# Patient Record
Sex: Female | Born: 1950 | Hispanic: No | Marital: Married
Health system: Southern US, Community
[De-identification: ages and names within clinical notes are randomized; demographics above are authoritative.]

---

## 1999-03-16 ENCOUNTER — Other Ambulatory Visit: Admission: RE | Admit: 1999-03-16 | Discharge: 1999-03-16 | Payer: Self-pay | Admitting: Obstetrics and Gynecology

## 1999-11-10 ENCOUNTER — Encounter: Payer: Self-pay | Admitting: Surgery

## 1999-11-10 ENCOUNTER — Encounter: Admission: RE | Admit: 1999-11-10 | Discharge: 1999-11-10 | Payer: Self-pay | Admitting: Surgery

## 2000-03-16 ENCOUNTER — Other Ambulatory Visit: Admission: RE | Admit: 2000-03-16 | Discharge: 2000-03-16 | Payer: Self-pay | Admitting: Obstetrics and Gynecology

## 2001-03-27 ENCOUNTER — Other Ambulatory Visit: Admission: RE | Admit: 2001-03-27 | Discharge: 2001-03-27 | Payer: Self-pay | Admitting: Obstetrics and Gynecology

## 2001-10-01 ENCOUNTER — Encounter: Admission: RE | Admit: 2001-10-01 | Discharge: 2001-10-01 | Payer: Self-pay | Admitting: Obstetrics and Gynecology

## 2001-10-01 ENCOUNTER — Encounter: Payer: Self-pay | Admitting: Obstetrics and Gynecology

## 2003-03-25 ENCOUNTER — Other Ambulatory Visit: Admission: RE | Admit: 2003-03-25 | Discharge: 2003-03-25 | Payer: Self-pay | Admitting: Obstetrics and Gynecology

## 2004-05-07 ENCOUNTER — Other Ambulatory Visit: Admission: RE | Admit: 2004-05-07 | Discharge: 2004-05-07 | Payer: Self-pay | Admitting: Obstetrics and Gynecology

## 2004-05-12 ENCOUNTER — Ambulatory Visit (HOSPITAL_COMMUNITY): Admission: RE | Admit: 2004-05-12 | Discharge: 2004-05-12 | Payer: Self-pay | Admitting: Obstetrics and Gynecology

## 2005-05-26 ENCOUNTER — Other Ambulatory Visit: Admission: RE | Admit: 2005-05-26 | Discharge: 2005-05-26 | Payer: Self-pay | Admitting: Obstetrics and Gynecology

## 2007-11-12 ENCOUNTER — Ambulatory Visit (HOSPITAL_COMMUNITY): Admission: RE | Admit: 2007-11-12 | Discharge: 2007-11-12 | Payer: Self-pay | Admitting: Obstetrics and Gynecology

## 2009-11-19 ENCOUNTER — Ambulatory Visit (HOSPITAL_COMMUNITY): Admission: RE | Admit: 2009-11-19 | Discharge: 2009-11-19 | Payer: Self-pay | Admitting: Obstetrics and Gynecology

## 2021-06-07 ENCOUNTER — Inpatient Hospital Stay (HOSPITAL_COMMUNITY): Payer: Medicare PPO

## 2021-06-07 ENCOUNTER — Emergency Department (HOSPITAL_COMMUNITY): Payer: Medicare PPO

## 2021-06-07 ENCOUNTER — Encounter (HOSPITAL_COMMUNITY): Payer: Self-pay | Admitting: Emergency Medicine

## 2021-06-07 ENCOUNTER — Inpatient Hospital Stay (HOSPITAL_COMMUNITY)
Admission: EM | Admit: 2021-06-07 | Discharge: 2021-06-26 | DRG: 082 | Disposition: E | Payer: Medicare PPO | Attending: Internal Medicine | Admitting: Internal Medicine

## 2021-06-07 DIAGNOSIS — R29729 NIHSS score 29: Secondary | ICD-10-CM | POA: Diagnosis present

## 2021-06-07 DIAGNOSIS — Z66 Do not resuscitate: Secondary | ICD-10-CM | POA: Diagnosis not present

## 2021-06-07 DIAGNOSIS — G935 Compression of brain: Secondary | ICD-10-CM | POA: Diagnosis present

## 2021-06-07 DIAGNOSIS — S06369A Traumatic hemorrhage of cerebrum, unspecified, with loss of consciousness of unspecified duration, initial encounter: Secondary | ICD-10-CM | POA: Diagnosis present

## 2021-06-07 DIAGNOSIS — I611 Nontraumatic intracerebral hemorrhage in hemisphere, cortical: Secondary | ICD-10-CM | POA: Diagnosis not present

## 2021-06-07 DIAGNOSIS — E854 Organ-limited amyloidosis: Secondary | ICD-10-CM | POA: Diagnosis present

## 2021-06-07 DIAGNOSIS — I619 Nontraumatic intracerebral hemorrhage, unspecified: Secondary | ICD-10-CM | POA: Diagnosis present

## 2021-06-07 DIAGNOSIS — E7800 Pure hypercholesterolemia, unspecified: Secondary | ICD-10-CM | POA: Diagnosis present

## 2021-06-07 DIAGNOSIS — J9601 Acute respiratory failure with hypoxia: Secondary | ICD-10-CM

## 2021-06-07 DIAGNOSIS — Z515 Encounter for palliative care: Secondary | ICD-10-CM

## 2021-06-07 DIAGNOSIS — I615 Nontraumatic intracerebral hemorrhage, intraventricular: Secondary | ICD-10-CM | POA: Diagnosis not present

## 2021-06-07 DIAGNOSIS — Y92009 Unspecified place in unspecified non-institutional (private) residence as the place of occurrence of the external cause: Secondary | ICD-10-CM

## 2021-06-07 DIAGNOSIS — Z4659 Encounter for fitting and adjustment of other gastrointestinal appliance and device: Secondary | ICD-10-CM

## 2021-06-07 DIAGNOSIS — I10 Essential (primary) hypertension: Secondary | ICD-10-CM | POA: Diagnosis present

## 2021-06-07 DIAGNOSIS — W19XXXA Unspecified fall, initial encounter: Secondary | ICD-10-CM | POA: Diagnosis present

## 2021-06-07 DIAGNOSIS — G919 Hydrocephalus, unspecified: Secondary | ICD-10-CM | POA: Diagnosis present

## 2021-06-07 DIAGNOSIS — I609 Nontraumatic subarachnoid hemorrhage, unspecified: Secondary | ICD-10-CM | POA: Diagnosis not present

## 2021-06-07 DIAGNOSIS — S061X9A Traumatic cerebral edema with loss of consciousness of unspecified duration, initial encounter: Secondary | ICD-10-CM | POA: Diagnosis present

## 2021-06-07 DIAGNOSIS — Z20822 Contact with and (suspected) exposure to covid-19: Secondary | ICD-10-CM | POA: Diagnosis present

## 2021-06-07 DIAGNOSIS — J690 Pneumonitis due to inhalation of food and vomit: Secondary | ICD-10-CM | POA: Diagnosis present

## 2021-06-07 DIAGNOSIS — Z7189 Other specified counseling: Secondary | ICD-10-CM | POA: Diagnosis not present

## 2021-06-07 DIAGNOSIS — J9690 Respiratory failure, unspecified, unspecified whether with hypoxia or hypercapnia: Secondary | ICD-10-CM

## 2021-06-07 LAB — DIFFERENTIAL
Abs Immature Granulocytes: 0.04 10*3/uL (ref 0.00–0.07)
Basophils Absolute: 0 10*3/uL (ref 0.0–0.1)
Basophils Relative: 0 %
Eosinophils Absolute: 0 10*3/uL (ref 0.0–0.5)
Eosinophils Relative: 0 %
Immature Granulocytes: 0 %
Lymphocytes Relative: 3 %
Lymphs Abs: 0.4 10*3/uL — ABNORMAL LOW (ref 0.7–4.0)
Monocytes Absolute: 0.7 10*3/uL (ref 0.1–1.0)
Monocytes Relative: 6 %
Neutro Abs: 11.3 10*3/uL — ABNORMAL HIGH (ref 1.7–7.7)
Neutrophils Relative %: 91 %

## 2021-06-07 LAB — CBC
HCT: 34 % — ABNORMAL LOW (ref 36.0–46.0)
Hemoglobin: 11.4 g/dL — ABNORMAL LOW (ref 12.0–15.0)
MCH: 29.8 pg (ref 26.0–34.0)
MCHC: 33.5 g/dL (ref 30.0–36.0)
MCV: 89 fL (ref 80.0–100.0)
Platelets: 183 10*3/uL (ref 150–400)
RBC: 3.82 MIL/uL — ABNORMAL LOW (ref 3.87–5.11)
RDW: 12.5 % (ref 11.5–15.5)
WBC: 12.5 10*3/uL — ABNORMAL HIGH (ref 4.0–10.5)
nRBC: 0 % (ref 0.0–0.2)

## 2021-06-07 LAB — I-STAT ARTERIAL BLOOD GAS, ED
Acid-Base Excess: 0 mmol/L (ref 0.0–2.0)
Bicarbonate: 22.3 mmol/L (ref 20.0–28.0)
Calcium, Ion: 1.1 mmol/L — ABNORMAL LOW (ref 1.15–1.40)
HCT: 31 % — ABNORMAL LOW (ref 36.0–46.0)
Hemoglobin: 10.5 g/dL — ABNORMAL LOW (ref 12.0–15.0)
O2 Saturation: 94 %
Patient temperature: 97.9
Potassium: 3.5 mmol/L (ref 3.5–5.1)
Sodium: 142 mmol/L (ref 135–145)
TCO2: 23 mmol/L (ref 22–32)
pCO2 arterial: 28.2 mmHg — ABNORMAL LOW (ref 32.0–48.0)
pH, Arterial: 7.506 — ABNORMAL HIGH (ref 7.350–7.450)
pO2, Arterial: 61 mmHg — ABNORMAL LOW (ref 83.0–108.0)

## 2021-06-07 LAB — COMPREHENSIVE METABOLIC PANEL
ALT: 20 U/L (ref 0–44)
AST: 31 U/L (ref 15–41)
Albumin: 4.3 g/dL (ref 3.5–5.0)
Alkaline Phosphatase: 51 U/L (ref 38–126)
Anion gap: 12 (ref 5–15)
BUN: 11 mg/dL (ref 8–23)
CO2: 22 mmol/L (ref 22–32)
Calcium: 9.3 mg/dL (ref 8.9–10.3)
Chloride: 103 mmol/L (ref 98–111)
Creatinine, Ser: 0.65 mg/dL (ref 0.44–1.00)
GFR, Estimated: >60 mL/min (ref >60)
Glucose, Bld: 182 mg/dL — ABNORMAL HIGH (ref 70–99)
Potassium: 3.7 mmol/L (ref 3.5–5.1)
Sodium: 137 mmol/L (ref 135–145)
Total Bilirubin: 1.2 mg/dL (ref 0.3–1.2)
Total Protein: 6.8 g/dL (ref 6.5–8.1)

## 2021-06-07 LAB — I-STAT CHEM 8, ED
BUN: 11 mg/dL (ref 8–23)
Calcium, Ion: 1.13 mmol/L — ABNORMAL LOW (ref 1.15–1.40)
Chloride: 103 mmol/L (ref 98–111)
Creatinine, Ser: 0.5 mg/dL (ref 0.44–1.00)
Glucose, Bld: 182 mg/dL — ABNORMAL HIGH (ref 70–99)
HCT: 34 % — ABNORMAL LOW (ref 36.0–46.0)
Hemoglobin: 11.6 g/dL — ABNORMAL LOW (ref 12.0–15.0)
Potassium: 3.6 mmol/L (ref 3.5–5.1)
Sodium: 139 mmol/L (ref 135–145)
TCO2: 25 mmol/L (ref 22–32)

## 2021-06-07 LAB — RESP PANEL BY RT-PCR (FLU A&B, COVID) ARPGX2
Influenza A by PCR: NEGATIVE
Influenza B by PCR: NEGATIVE
SARS Coronavirus 2 by RT PCR: NEGATIVE

## 2021-06-07 LAB — SODIUM
Sodium: 141 mmol/L (ref 135–145)
Sodium: 146 mmol/L — ABNORMAL HIGH (ref 135–145)
Sodium: 148 mmol/L — ABNORMAL HIGH (ref 135–145)

## 2021-06-07 LAB — HEMOGLOBIN A1C
Hgb A1c MFr Bld: 5.3 % (ref 4.8–5.6)
Mean Plasma Glucose: 105.41 mg/dL

## 2021-06-07 LAB — LIPID PANEL
Cholesterol: 131 mg/dL (ref 0–200)
HDL: 54 mg/dL (ref >40)
LDL Cholesterol: 60 mg/dL (ref 0–99)
Total CHOL/HDL Ratio: 2.4 RATIO
Triglycerides: 87 mg/dL (ref <150)
VLDL: 17 mg/dL (ref 0–40)

## 2021-06-07 LAB — ETHANOL: Alcohol, Ethyl (B): <10 mg/dL (ref <10)

## 2021-06-07 LAB — PROTIME-INR
INR: 1 (ref 0.8–1.2)
Prothrombin Time: 12.8 seconds (ref 11.4–15.2)

## 2021-06-07 LAB — APTT: aPTT: 23 seconds — ABNORMAL LOW (ref 24–36)

## 2021-06-07 LAB — CBG MONITORING, ED: Glucose-Capillary: 170 mg/dL — ABNORMAL HIGH (ref 70–99)

## 2021-06-07 LAB — MRSA NEXT GEN BY PCR, NASAL: MRSA by PCR Next Gen: NOT DETECTED

## 2021-06-07 MED ORDER — LABETALOL HCL 5 MG/ML IV SOLN
INTRAVENOUS | Status: AC | PRN
  Administered 2021-06-07: 10 mg via INTRAVENOUS

## 2021-06-07 MED ORDER — SENNOSIDES-DOCUSATE SODIUM 8.6-50 MG PO TABS
1.0000 | ORAL_TABLET | Freq: Two times a day (BID) | ORAL | Status: DC
  Administered 2021-06-07: 1
  Filled 2021-06-07: qty 1

## 2021-06-07 MED ORDER — CHLORHEXIDINE GLUCONATE 0.12% ORAL RINSE (MEDLINE KIT)
15.0000 mL | Freq: Two times a day (BID) | OROMUCOSAL | Status: DC
  Administered 2021-06-07 – 2021-06-09 (×5): 15 mL via OROMUCOSAL

## 2021-06-07 MED ORDER — ORAL CARE MOUTH RINSE
15.0000 mL | OROMUCOSAL | Status: DC
  Administered 2021-06-07 – 2021-06-08 (×11): 15 mL via OROMUCOSAL

## 2021-06-07 MED ORDER — CHLORHEXIDINE GLUCONATE CLOTH 2 % EX PADS
6.0000 | MEDICATED_PAD | Freq: Every day | CUTANEOUS | Status: DC
  Administered 2021-06-07 – 2021-06-08 (×2): 6 via TOPICAL

## 2021-06-07 MED ORDER — ACETAMINOPHEN 160 MG/5ML PO SOLN
650.0000 mg | ORAL | Status: DC | PRN
  Administered 2021-06-07 – 2021-06-08 (×3): 650 mg
  Filled 2021-06-07 (×3): qty 20.3

## 2021-06-07 MED ORDER — CLEVIDIPINE BUTYRATE 0.5 MG/ML IV EMUL
INTRAVENOUS | Status: AC
  Administered 2021-06-07: 1 mg via INTRAVENOUS
  Filled 2021-06-07: qty 50

## 2021-06-07 MED ORDER — PANTOPRAZOLE SODIUM 40 MG IV SOLR
40.0000 mg | Freq: Every day | INTRAVENOUS | Status: DC
  Administered 2021-06-07: 40 mg via INTRAVENOUS
  Filled 2021-06-07: qty 40

## 2021-06-07 MED ORDER — ROCURONIUM BROMIDE 50 MG/5ML IV SOLN
INTRAVENOUS | Status: AC | PRN
  Administered 2021-06-07: 80 mg via INTRAVENOUS

## 2021-06-07 MED ORDER — PIPERACILLIN-TAZOBACTAM 3.375 G IVPB 30 MIN
3.3750 g | Freq: Once | INTRAVENOUS | Status: DC
Start: 2021-06-07 — End: 2021-06-07
  Filled 2021-06-07: qty 50

## 2021-06-07 MED ORDER — SODIUM CHLORIDE 3 % IV SOLN
INTRAVENOUS | Status: DC
  Filled 2021-06-07 (×5): qty 500

## 2021-06-07 MED ORDER — ETOMIDATE 2 MG/ML IV SOLN
INTRAVENOUS | Status: AC | PRN
  Administered 2021-06-07: 20 mg via INTRAVENOUS

## 2021-06-07 MED ORDER — CLEVIDIPINE BUTYRATE 0.5 MG/ML IV EMUL
0.0000 mg/h | INTRAVENOUS | Status: DC
  Administered 2021-06-07: 18 mg/h via INTRAVENOUS
  Administered 2021-06-07: 21 mg/h via INTRAVENOUS
  Administered 2021-06-07: 18 mg/h via INTRAVENOUS
  Administered 2021-06-07: 15 mg/h via INTRAVENOUS
  Administered 2021-06-07: 17 mg/h via INTRAVENOUS
  Administered 2021-06-07: 13 mg/h via INTRAVENOUS
  Administered 2021-06-07: 21 mg/h via INTRAVENOUS
  Administered 2021-06-08 (×2): 18 mg/h via INTRAVENOUS
  Administered 2021-06-08: 20 mg/h via INTRAVENOUS
  Administered 2021-06-08: 18 mg/h via INTRAVENOUS
  Administered 2021-06-08: 20 mg/h via INTRAVENOUS
  Filled 2021-06-07 (×3): qty 50
  Filled 2021-06-07 (×2): qty 100
  Filled 2021-06-07 (×5): qty 50

## 2021-06-07 MED ORDER — ACETAMINOPHEN 650 MG RE SUPP: 650.0000 mg | RECTAL | Status: DC | PRN

## 2021-06-07 MED ORDER — SODIUM CHLORIDE 3 % IV BOLUS
250.0000 mL | Freq: Once | INTRAVENOUS | Status: AC
  Administered 2021-06-07: 250 mL via INTRAVENOUS

## 2021-06-07 MED ORDER — ACETAMINOPHEN 325 MG PO TABS: 650.0000 mg | ORAL_TABLET | ORAL | Status: DC | PRN

## 2021-06-07 MED ORDER — IOHEXOL 350 MG/ML SOLN
70.0000 mL | Freq: Once | INTRAVENOUS | Status: AC | PRN
  Administered 2021-06-07: 70 mL via INTRAVENOUS

## 2021-06-07 MED ORDER — STROKE: EARLY STAGES OF RECOVERY BOOK
Freq: Once | Status: AC
  Filled 2021-06-07: qty 1

## 2021-06-07 NOTE — ED Notes (Signed)
Pt arrived via [facility] with c/c of stroke. Code stroke called at [phone]. Pts LKN 9/11 @ 2100, family states they heard a thud at 2300 but did not think to check on pt. Per EMS pt found this morning on floor with right sided gaze and surrounded in vomit. Pt arrived where she arrived in feces and urine. Pt observed at bridge by Neuro & EDP and taken to scanner. Code Stroke Log tells remaining story with times.

## 2021-06-07 NOTE — ED Provider Notes (Signed)
MSE was initiated and I personally evaluated the patient and placed orders (if any) at  6:54 AM on 2021-07-01.  The patient appears stable so that the remainder of the MSE may be completed by another provider.  Procedure Name: Intubation Date/Time: Jul 01, 2021 6:56 AM Performed by: Arthor Captain, PA-C Pre-anesthesia Checklist: Patient identified, Suction available, Patient being monitored and Emergency Drugs available Oxygen Delivery Method: Non-rebreather mask Preoxygenation: Pre-oxygenation with 100% oxygen Induction Type: IV induction and Rapid sequence Ventilation: Mask ventilation without difficulty Laryngoscope Size: Glidescope and 4 Grade View: Grade II Endobronchial tube: 28 Fr Tube size: 7.0 mm Number of attempts: 1 Airway Equipment and Method: Video-laryngoscopy and Rigid stylet Placement Confirmation: ETT inserted through vocal cords under direct vision, CO2 detector, Breath sounds checked- equal and bilateral and Positive ETCO2 Secured at: 25 cm Tube secured with: ETT holder Dental Injury: Teeth and Oropharynx as per pre-operative assessment  Future Recommendations: Recommend- induction with short-acting agent, and alternative techniques readily available       Arthor Captain, PA-C 01-Jul-2021 0658    Palumbo, April, MD July 01, 2021 2563

## 2021-06-07 NOTE — Progress Notes (Signed)
I met and discussed situation with the patient's daughter.  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  I continue to recommend supportive/comfort care alone.  I do not see any situation where surgical intervention will be beneficial to her long-term outcome.

## 2021-06-07 NOTE — Consult Note (Signed)
Neurology Consultation  Reason for Consult: Code stroke, found down unresponsive Referring Physician: Dr. Daun Peacock  CC: Found down, unresponsive with sonorous respiration  History is obtained from: Chart, EMS  HPI: Melissa Jacobson is a 70 y.o. female unknown past medical history, visiting family from out of town, brought in by EMS after family found her down on the ground unresponsive. She went to bed last night at 9 PM and at 11 PM family heard some loud thud.  They did not make much of it but this morning she would not wake up.  They called EMS who found her to be incontinent, vomited upon herself and brought her in as a code stroke.  Systolic blood pressure 220s on site.  EMS also noted some right-sided gaze deviation which had resolved on the way. Poor examination with GCS 7 at best on arrival that quickly converted to a GCS of 3.  Emergent intubation. Noncontrasted CT was done prior to intubation. Noncontrasted CT shows multifocal ICH, IVH, obliteration of the basal ventricular system and compression of brainstem.  Daughter came in later and said that she was in her usual state of health before going to bed yesterday.  No complaints of headache.  Has not had any memory issues or any other neurological symptoms.  Sees doctors regularly-lives in [place].  No history of hypertension.  Only takes a small dose of cholesterol medication for hypercholesterolemia. Spoke with husband in [place] over [place].  He is very baffled at this sequence of events and said he had spoken to her last night with absolutely no indication of any issues.   LKW: 9pm 06/06/21 tpa given?: no, ich Premorbid modified Rankin scale (mRS):0  ICH Score: 3, GCS 7 at best very quickly declined to 3 making the ICH score possibly 4 within a few minutes of arrival.    WYO:VZCHYI to obtain due to altered mental status.   History reviewed. No pertinent past medical history.      History reviewed. No pertinent family  history.   Social History:   has no history on file for tobacco use, alcohol use, and drug use.  Medications  Current Facility-Administered Medications:    clevidipine (CLEVIPREX) infusion 0.5 mg/mL, 0-21 mg/hr, Intravenous, Continuous, Milon Dikes, MD, Last Rate: 12 mL/hr at 06/19/2021 0728, 6 mg/hr at 05/27/2021 0728   piperacillin-tazobactam (ZOSYN) IVPB 3.375 g, 3.375 g, Intravenous, Once, Palumbo, April, MD   sodium chloride (hypertonic) 3 % solution, , Intravenous, Continuous, Milon Dikes, MD   sodium chloride 3% (hypertonic) IV bolus 250 mL, 250 mL, Intravenous, Once, Milon Dikes, MD No current outpatient medications on file.   Exam: Current vital signs: BP (!) 122/47   Pulse 83   Temp 97.9 F (36.6 C)   Resp 16   Ht 5\' 4"  (1.626 m)   Wt 66.2 kg   SpO2 100%   BMI 25.06 kg/m  Vital signs in last 24 hours: Temp:  [97.9 F (36.6 C)] 97.9 F (36.6 C) (09/12 0654) Pulse Rate:  [68-83] 83 (09/12 0730) Resp:  [16-18] 16 (09/12 0730) BP: (122-188)/(47-81) 122/47 (09/12 0730) SpO2:  [96 %-100 %] 100 % (09/12 0730) FiO2 (%):  [100 %] 100 % (09/12 0700) Weight:  [66.2 kg] 66.2 kg (09/12 0654) General: Obtunded with sonorous breathing HEENT: Normocephalic/atraumatic, neck in a c-collar Lungs: Scattered rales Abdomen nondistended nontender CVS: Regular rate rhythm Neurological exam Mental status: Obtunded, no eye-opening to voice or noxious stimulation. Nonverbal.  Does not follow commands Cranials: Pupils are 3 mm  sluggishly reactive, no gaze preference or deviation, does not blink to threat from either side, face appears symmetric. Motor examination: No spontaneous movement initially.  To noxious stimulation, at best localization versus flexor. Sensory exam: As above Coordination cannot be assessed NIH stroke scale 29 on arrival at 6:33 AM  Labs I have reviewed labs in epic and the results pertinent to this consultation are:  CBC    Component Value Date/Time    WBC 12.5 (H) 06/29/21 0651   RBC 3.82 (L) 06-29-2021 0651   HGB 11.6 (L) 06/29/2021 0702   HCT 34.0 (L) 29-Jun-2021 0702   PLT 183 06-29-2021 0651   MCV 89.0 06-29-21 0651   MCH 29.8 29-Jun-2021 0651   MCHC 33.5 06-29-2021 0651   RDW 12.5 June 29, 2021 0651   LYMPHSABS 0.4 (L) June 29, 2021 0651   MONOABS 0.7 Jun 29, 2021 0651   EOSABS 0.0 2021/06/29 0651   BASOSABS 0.0 2021-06-29 0651    CMP     Component Value Date/Time   NA 139 06/29/21 0702   K 3.6 06-29-2021 0702   CL 103 06/29/21 0702   GLUCOSE 182 (H) 06-29-2021 0702   BUN 11 2021-06-29 0702   CREATININE 0.50 06-29-2021 0702    Imaging I have reviewed the images obtained:  CT-head personally reviewed.  Multicentric right hemispheric intra-axial hemorrhage with total blood volume estimated to be about 130 cc.  Midline shift to the left with 9 mm with left uncal herniation and partially effaced basilar cisterns.  Severe edema in the right hemisphere and likely also in the brainstem.  Effaced right lateral and effaced third ventricles with trapped left lateral ventricle and interventricular hemorrhage.  Small volume right cerebral subarachnoid hemorrhage.  Assessment:  70 year old woman with no significant past medical history other than that of hyperlipidemia, who lives in [place], was visiting family here, last known well at 9 PM when she went to bed in her daughter's house, did not wake up this morning and was found down on the ground with sonorous respiration and unresponsive.  EMS noted some right-sided gaze.  She was found in her vomitus and had incontinence of stools and urine. Code stroke was activated due to LVO screen being positive. Best exam documented above with GCS of 7 that quickly converted to a GCS of 3. Required emergent intubation for airway protection as family was on their way-there was no information in the chart and EMS did not provide any family information. CT head with a large multicentric right  hemispheric intra-axial hemorrhage with a total blood volume of nearly 130 cc with midline shift to the left of about 9 mm.  Uncal herniation and partially effaced does assistance.  Severe edema in the right hemisphere and also cerebral edema involving the brainstem.  Effacement of right lateral ventricle and effaced third ventricle with Lateral ventricle and interventricular hemorrhage along with small volume right cerebral hemispheric subarachnoid hemorrhage. Etiology at this time remains elusive. This does not look like a typical hypertensive bleed.  She was hypertensive on arrival but has had no history of hypertension and goes to the doctor regularly per family.  On no antihypertensive medication. Cerebral amyloid angiopathy should be in the differential. Less likely to be hemorrhagic mets based on the appearance and no history of cancers.  Recommendations: -This bleed is devastating and likely going to be nonsurvivable but the family would like everything done for now. -Maximal medical management to include blood pressure control with strict parameters of systolic blood pressure less than 140.  Gave her 1 dose of labetalol and started her on Cleviprex drip. -Ordered bolus to 50 cc 3% saline followed by 3% at 75 cc an hour for hyperosmolar therapy for the cytotoxic edema. Goal Na 150-155 - Avoid hyperthermia and hyperglycemia -CTA head and neck to look for any vascular etiology such as large MCA aneurysm which sometimes can present with hemispheric intracerebral bleeds. -CT of the neck-she was found down-to ensure no fracture -Emergent neurosurgical consultation-neurosurgeon on-call has been made aware and will see the patient. -May need whole-body scan to evaluate for possible malignancy-although low on differential. -ER is contacting critical care for admission. I would also recommend involving palliative care early in the course as this bleed may be nonsurvivable. -Frequent neurochecks and  tele -Consider MRI brain to look for structural abnormality underlying bleed - may not be hight yield.  Stroke team will follow with you.  Plan was discussed with the daughter at bedside, Dr. Daun Peacock in the ER and Arthor Captain, PA-C in the ER.  -- Milon Dikes, MD Neurologist [facility] Pager: [phone]  Present on admission: ICH, cerebral edema, compression of the brainstem, ICH score of 3-4 portending from 72-97 percent 30-day mortality, ventilator dependent respiratory failure, hypertensive emergency with systolic blood pressures in the 220s.Possible aspiration pneumonia  CRITICAL CARE ATTESTATION Performed by: Milon Dikes, MD Total critical care time: 90 minutes Critical care time was exclusive of separately billable procedures and treating other patients and/or supervising APPs/Residents/Students Critical care was necessary to treat or prevent imminent or life-threatening deterioration due to ICH, HTN emergency  This patient is critically ill and at significant risk for neurological worsening and/or death and care requires constant monitoring. Critical care was time spent personally by me on the following activities: development of treatment plan with patient and/or surrogate as well as nursing, discussions with consultants, evaluation of patient's response to treatment, examination of patient, obtaining history from patient or surrogate, ordering and performing treatments and interventions, ordering and review of laboratory studies, ordering and review of radiographic studies, pulse oximetry, re-evaluation of patient's condition, participation in multidisciplinary rounds and medical decision making of high complexity in the care of this patient.

## 2021-06-07 NOTE — Consult Note (Signed)
Consultation Note Date: Jun 18, 2021   Patient Name: Melissa Jacobson  DOB: [birth year]  MRN: [id number]  Age / Sex: 70 y.o., female  PCP: Pcp, No Referring Physician: Cheri Fowler, MD  Reason for Consultation: Large ICH with IVH  HPI/Patient Profile: 70 y.o. female  with no known past medical history  admitted on 2021-06-18 after being found down in her home by her daughter. Workup reveals devastating multifocal ICH with IVH and transtentorial herniation. No surgical interventions recommended. ICH score initially 7 but very quickly declined to a 3 indicating a 97% likelihood of 30 day mortality. Palliative medicine consult for discussion of terminal care.  Primary Decision Maker NEXT OF KIN- spouse- Analy Bassford  Discussion: Chart reviewed and patient evaluated.  Daughter, Melissa Jacobson at bedside- very tearful. Shared that her Mom was from [place] and she and her Dad had retired to [place]. Mom was visiting to help [place] out with some personal situations.  Gave emotional support and room to share feelings about current situation.  I was present as Dr. Jordan Likes visited and shared devastating prognosis.  Code status was discussed- during visit patient was having ST elevation. I encouraged [name] to call her Dad so we could discuss code status. She requested a physician to discuss her Mom's state and recommendations with her Father.  Dr. Merrily Pew was kindly present and discussed Melissa Jacobson's grave condition and prognosis and recommended DNR.  Mr. Cavagnaro first declined DNR. He called Melissa Jacobson back and then they confirmed wish for DNR to me.  I further discussed goals for Ms. [place] and anticipated care planning with [name]. At this time goals are to continue life sustaining support with DNR in place in hopes that Mr. Devonshire can visit before she dies.  I shared with [name], with her permission, what the next steps and  decisions would be- to continue artificial life support vs remove and transition to comfort measures and allow for peaceful natural death. [name] readily stated that the goal would be to discontinue life support when her Father was ready- they would not elect for long term artificial support given her severe brain damage. At her request I discussed what that process would look like.      SUMMARY OF RECOMMENDATIONS -DNR -Continue life sustaining support at this time until patient's spouse arrives- he is in transit from [place]- plan is for terminal extubation and full comfort only once he has arrived and family is ready    Code Status/Advance Care Planning: DNR   Prognosis:   Hours - Days  Discharge Planning: To Be Determined  Primary Diagnoses: Present on Admission:  ICH (intracerebral hemorrhage) (HCC)   Review of Systems  Unable to perform ROS: Intubated   Physical Exam Vitals and nursing note reviewed.  Cardiovascular:     Rate and Rhythm: Tachycardia present.  Pulmonary:     Comments: intubated Neurological:     Comments: unresponsive    Vital Signs: BP (!) 141/47   Pulse (!) 114   Temp (!) 102.6 F (39.2 C) (Axillary) Comment: RN  Junious Dresser made aware  Resp 20   Ht 5\' 4"  (1.626 m)   Wt 57.6 kg   SpO2 100%   BMI 21.80 kg/m          SpO2: SpO2: 100 % O2 Device:SpO2: 100 % O2 Flow Rate: .   IO: Intake/output summary:  Intake/Output Summary (Last 24 hours) at 06/15/2021 1221 Last data filed at 06/15/2021 1000 Gross per 24 hour  Intake 144.13 ml  Output --  Net 144.13 ml    LBM: Last BM Date: 05/31/2021 Baseline Weight: Weight: 66.2 kg Most recent weight: Weight: 57.6 kg      Palliative Assessment/Data: PPS: 10%       Thank you for this consult. Palliative medicine will continue to follow and assist as needed.   Time In: 1110 Time Out: 1220 Time Total: 70 mins Greater than 50%  of this time was spent counseling and coordinating care related to  the above assessment and plan.  Signed by: 1221, AGNP-C Palliative Medicine    Please contact Palliative Medicine Team phone at [phone] for questions and concerns.  For individual provider: See [phone]

## 2021-06-07 NOTE — Progress Notes (Signed)
Pt transported to CT and back to TRA A without any complications.  

## 2021-06-07 NOTE — H&P (Signed)
NAME:  Melissa Jacobson, MRN:  [id number], DOB:  07-30-1951, LOS: 0 ADMISSION DATE:  06-08-2021, CONSULTATION DATE: 06/08/21 ALT 80 REFERRING MD:  Cy Blamer, MD CHlIEF COMPLAINT: Unresponsiveness  History of Present Illness:  70 year old female with hypertension and hyperlipidemia who was visiting from [place] to her daughter in [place].  Last well-known was 9 PM last night, her daughter heard tired around 17 PM, thought it was noise from neighbors.  She found her mother around 5:30 AM covered in vomitus and unresponsive, then called EMS and she was brought in here. On arrival stroke team was called, patient had CT head which showed large intraparenchymal hemorrhage with IVH, she was intubated for airway protection and possible aspiration.  She was started on 3% saline after giving mannitol bolus.  PCCM was consulted for evaluation and help with management Pertinent  Medical History  Hypertension Hyperlipidemia   Significant Hospital Events: Including procedures, antibiotic start and stop dates in addition to other pertinent events     Interim History / Subjective:    Objective   Blood pressure (!) 155/51, pulse (!) 112, temperature 97.9 F (36.6 C), resp. rate (!) 24, height 5\' 4"  (1.626 m), weight 66.2 kg, SpO2 100 %.    Vent Mode: PRVC FiO2 (%):  [100 %] 100 % Set Rate:  [16 bmp] 16 bmp Vt Set:  [430 mL-500 mL] 430 mL PEEP:  [5 cmH20] 5 cmH20 Plateau Pressure:  [16 cmH20] 16 cmH20  No intake or output data in the 24 hours ending 2021-06-08 0911 Filed Weights   06-08-2021 0654  Weight: 66.2 kg    Examination:   Physical exam: General: Crtitically ill-appearing elderly female, orally intubated HEENT: Atraumatic, eyes closed, no JVD, trachea central, ETT and OGT in place Neuro: Eyes closed, not following commands.  Pupils 5 mm bilateral nonreactive to light, absent corneal.  Weak cough and gag is present.  On motor exam patient has extensive posturing in both upper  extremities, triple flexion in both lower extremities to painful stimuli Chest: Coarse breath sounds, no wheezes or rhonchi Heart: Regular rate and rhythm, no murmurs or gallops Abdomen: Soft, nontender, nondistended, bowel sounds present Skin: No rash  Resolved Hospital Problem list     Assessment & Plan:  Right intraparenchymal hemorrhage with intraventricular hemorrhage, ICH score of 4 with expected mortality 97% Brain herniation syndrome Acute hydrocephalus Traumatic subarachnoid hemorrhage on right side Acute hypoxic respiratory failure due to aspiration pneumonitis Hypertension Hyperlipidemia  Patient CT scan showed large right intraparenchymal hemorrhage in frontal parietal region with intraventricular extension and brainstem herniation She received IV mannitol bolus Started on 3% saline Monitor serum sodium Continue neuro watch every hour Maintain blood pressure less than 140 Continue clevidipine infusion as needed Elevate head end of the bed Aspiration and seizure precautions She does have symptomatic subarachnoid hemorrhage on the right side I think that happened after a fall She was noted to be covered in vomitus there is a possibility that she has aspirated Her ABG does suggest hypoxia She received IV Zosyn in the emergency department, will hold off on antibiotic for now Monitor blood pressure Neurology and neurosurgical teams are consulted  Grave prognosis  Best Practice (right click and "Reselect all SmartList Selections" daily)   Diet/type: NPO DVT prophylaxis: not indicated GI prophylaxis: PPI Lines: N/A Foley:  Yes, and it is still needed Code Status:  full code Last date of multidisciplinary goals of care discussion [pending]  Labs   CBC: Recent Labs  Lab June 08, 2021 0651  2021/07/02 0702 07-02-2021 0810  WBC 12.5*  --   --   NEUTROABS 11.3*  --   --   HGB 11.4* 11.6* 10.5*  HCT 34.0* 34.0* 31.0*  MCV 89.0  --   --   PLT 183  --   --     Basic  Metabolic Panel: Recent Labs  Lab 02-Jul-2021 0651 07/02/21 0702 Jul 02, 2021 0810  NA 137 139 142  K 3.7 3.6 3.5  CL 103 103  --   CO2 22  --   --   GLUCOSE 182* 182*  --   BUN 11 11  --   CREATININE 0.65 0.50  --   CALCIUM 9.3  --   --    GFR: Estimated Creatinine Clearance: 61.3 mL/min (by C-G formula based on SCr of 0.5 mg/dL). Recent Labs  Lab Jul 02, 2021 0651  WBC 12.5*    Liver Function Tests: Recent Labs  Lab July 02, 2021 0651  AST 31  ALT 20  ALKPHOS 51  BILITOT 1.2  PROT 6.8  ALBUMIN 4.3   No results for input(s): LIPASE, AMYLASE in the last 168 hours. No results for input(s): AMMONIA in the last 168 hours.  ABG    Component Value Date/Time   PHART 7.506 (H) 2021/07/02 0810   PCO2ART 28.2 (L) 07-02-2021 0810   PO2ART 61 (L) 07/02/2021 0810   HCO3 22.3 2021-07-02 0810   TCO2 23 2021/07/02 0810   O2SAT 94.0 07/02/2021 0810     Coagulation Profile: Recent Labs  Lab Jul 02, 2021 0651  INR 1.0    Cardiac Enzymes: No results for input(s): CKTOTAL, CKMB, CKMBINDEX, TROPONINI in the last 168 hours.  HbA1C: No results found for: HGBA1C  CBG: Recent Labs  Lab 07/02/2021 0635  GLUCAP 170*    Review of Systems:   Unable to obtain as patient is intubated and sedated  Past Medical History:  She,  has no past medical history on file.   Surgical History:  History reviewed. No pertinent surgical history.   Social History:      Family History:  Her family history is not on file.   Allergies Not on File   Home Medications  Prior to Admission medications   Not on File     Critical care time:      Total critical care time: 49 minutes  Performed by: Cheri Fowler   Critical care time was exclusive of separately billable procedures and treating other patients.   Critical care was necessary to treat or prevent imminent or life-threatening deterioration.   Critical care was time spent personally by me on the following activities: development of  treatment plan with patient and/or surrogate as well as nursing, discussions with consultants, evaluation of patient's response to treatment, examination of patient, obtaining history from patient or surrogate, ordering and performing treatments and interventions, ordering and review of laboratory studies, ordering and review of radiographic studies, pulse oximetry and re-evaluation of patient's condition.   Cheri Fowler MD [facility] See Amion for pager If no response to pager, please call [phone] until 7pm After 7pm, Please call E-link [phone]

## 2021-06-07 NOTE — Progress Notes (Signed)
Pt transported to from TRA A to 4N27 without any complications.

## 2021-06-07 NOTE — Consult Note (Signed)
Providing Compassionate, Quality Care - Together   Reason for Consult: Intraventricular Referring Physician: Dr. Morene AntuPalumbo  Melissa Jacobson is an 70 y.o. female.  HPI: Patient is 70 year old female from [place]. She has been visiting her daughter in [place]. Per family report, she has no significant PMH aside from hypercholesteremia. She was in her usual state of health when she went to bed last night. Daughter reports she heard a "thud" around 11 pm last night, but thought it was the neighbors. She found her mother on the ground around 5:30 am, with vomitus around her. [facility] was [facility] the [facility] via EMS. Her pupils were fixed and dilated. Her respiratory rate was four upon assessment and lung sounds rhonchus. She was intubated for airway protection. CT head showed severe multicentric right hemisphere intra-axial hemorrhage, with a leftward midline shift of 9 mm. There is left uncal herniation and partially effaced basilar cisterns. Neurosurgery was consulted for further evaluation and recommendations. At the time of this assessment, she just received a 3% saline bolus.  History reviewed. No pertinent past medical history.  History reviewed. No pertinent surgical history.  History reviewed. No pertinent family history.  Social History:  has no history on file for tobacco use, alcohol use, and drug use.  Allergies: Not on File  Medications: I have reviewed the patient's current medications.  Results for orders placed or performed during the hospital encounter of 03-05-21 (from the past 48 hour(s))  CBG monitoring, ED     Status: Abnormal   Collection Time: 03-05-21  6:35 AM  Result Value Ref Range   Glucose-Capillary 170 (H) 70 - 99 mg/dL    Comment: Glucose reference range applies only to samples taken after fasting for at least 8 hours.  Ethanol     Status: None   Collection Time: 03-05-21  6:51 AM  Result Value Ref Range   Alcohol, Ethyl (B) <10 <10 mg/dL    Comment:  (NOTE) Lowest detectable limit for serum alcohol is 10 mg/dL.  For medical purposes only. Performed at [facility], [address] [place], [place] [address]   Protime-INR     Status: None   Collection Time: 03-05-21  6:51 AM  Result Value Ref Range   Prothrombin Time 12.8 11.4 - 15.2 seconds   INR 1.0 0.8 - 1.2    Comment: (NOTE) INR goal varies based on device and disease states. Performed at [facility], [address] [place], [place] [address]   APTT     Status: Abnormal   Collection Time: 03-05-21  6:51 AM  Result Value Ref Range   aPTT 23 (L) 24 - 36 seconds    Comment: Performed at [facility], [address] [place], [place] [address]  CBC     Status: Abnormal   Collection Time: 03-05-21  6:51 AM  Result Value Ref Range   WBC 12.5 (H) 4.0 - 10.5 K/uL   RBC 3.82 (L) 3.87 - 5.11 MIL/uL   Hemoglobin 11.4 (L) 12.0 - 15.0 g/dL   HCT 44.034.0 (L) 34.736.0 - 42.546.0 %   MCV 89.0 80.0 - 100.0 fL   MCH 29.8 26.0 - 34.0 pg   MCHC 33.5 30.0 - 36.0 g/dL   RDW 95.612.5 38.711.5 - 56.415.5 %   Platelets 183 150 - 400 K/uL   nRBC 0.0 0.0 - 0.2 %    Comment: Performed at [facility], [address] [place], [place] [address]  Differential     Status: Abnormal   Collection Time: 06/11/2021  6:51 AM  Result Value Ref Range   Neutrophils Relative % 91 %   Neutro Abs 11.3 (H) 1.7 - 7.7 K/uL   Lymphocytes Relative 3 %   Lymphs Abs 0.4 (L) 0.7 - 4.0 K/uL   Monocytes Relative 6 %   Monocytes Absolute 0.7 0.1 - 1.0 K/uL   Eosinophils Relative 0 %   Eosinophils Absolute 0.0 0.0 - 0.5 K/uL   Basophils Relative 0 %   Basophils Absolute 0.0 0.0 - 0.1 K/uL   Immature Granulocytes 0 %   Abs Immature Granulocytes 0.04 0.00 - 0.07 K/uL    Comment: Performed at [facility], [address], [place], [place] [address]  Comprehensive metabolic panel     Status: Abnormal   Collection Time: 06/20/2021  6:51 AM  Result Value Ref Range   Sodium 137 135 - 145 mmol/L   Potassium  3.7 3.5 - 5.1 mmol/L   Chloride 103 98 - 111 mmol/L   CO2 22 22 - 32 mmol/L   Glucose, Bld 182 (H) 70 - 99 mg/dL    Comment: Glucose reference range applies only to samples taken after fasting for at least 8 hours.   BUN 11 8 - 23 mg/dL   Creatinine, Ser 6.04 0.44 - 1.00 mg/dL   Calcium 9.3 8.9 - 54.0 mg/dL   Total Protein 6.8 6.5 - 8.1 g/dL   Albumin 4.3 3.5 - 5.0 g/dL   AST 31 15 - 41 U/L   ALT 20 0 - 44 U/L   Alkaline Phosphatase 51 38 - 126 U/L   Total Bilirubin 1.2 0.3 - 1.2 mg/dL   GFR, Estimated >98 >11 mL/min    Comment: (NOTE) Calculated using the CKD-EPI Creatinine Equation (2021)    Anion gap 12 5 - 15    Comment: Performed at [facility], [address], [place], [place] [address]  Resp Panel by RT-PCR (Flu A&B, Covid) Nasopharyngeal Swab     Status: None   Collection Time: 06/15/2021  6:53 AM   Specimen: Nasopharyngeal Swab; Nasopharyngeal(NP) swabs in vial transport medium  Result Value Ref Range   SARS Coronavirus 2 by RT PCR NEGATIVE NEGATIVE    Comment: (NOTE) SARS-CoV-2 target nucleic acids are NOT DETECTED.  The SARS-CoV-2 RNA is generally detectable in upper respiratory specimens during the acute phase of infection. The lowest concentration of SARS-CoV-2 viral copies this assay can detect is 138 copies/mL. A negative result does not preclude SARS-Cov-2 infection and should not be used as the sole basis for treatment or other patient management decisions. A negative result may occur with  improper specimen collection/handling, submission of specimen other than nasopharyngeal swab, presence of viral mutation(s) within the areas targeted by this assay, and inadequate number of viral copies(<138 copies/mL). A negative result must be combined with clinical observations, patient history, and epidemiological information. The expected result is Negative.  Fact Sheet for Patients:  BloggerCourse.com  Fact Sheet for Healthcare  Providers:  SeriousBroker.it  This test is no t yet approved or cleared by the [place] FDA and  has been authorized for detection and/or diagnosis of SARS-CoV-2 by FDA under an Emergency Use Authorization (EUA). This EUA will remain  in effect (meaning this test can be used) for the duration of the COVID-19 declaration under Section 564(b)(1) of the Act, 21 U.S.C.section 360bbb-3(b)(1), unless the authorization is terminated  or revoked sooner.  Influenza A by PCR NEGATIVE NEGATIVE   Influenza B by PCR NEGATIVE NEGATIVE    Comment: (NOTE) The Xpert Xpress SARS-CoV-2/FLU/RSV plus assay is intended as an aid in the diagnosis of influenza from Nasopharyngeal swab specimens and should not be used as a sole basis for treatment. Nasal washings and aspirates are unacceptable for Xpert Xpress SARS-CoV-2/FLU/RSV testing.  Fact Sheet for Patients: BloggerCourse.com  Fact Sheet for Healthcare Providers: SeriousBroker.it  This test is not yet approved or cleared by the [place] FDA and has been authorized for detection and/or diagnosis of SARS-CoV-2 by FDA under an Emergency Use Authorization (EUA). This EUA will remain in effect (meaning this test can be used) for the duration of the COVID-19 declaration under Section 564(b)(1) of the Act, 21 U.S.C. section 360bbb-3(b)(1), unless the authorization is terminated or revoked.  Performed at [facility], [address] [place], [place] [address]   I-stat chem 8, ED     Status: Abnormal   Collection Time: 21-Jun-2021  7:02 AM  Result Value Ref Range   Sodium 139 135 - 145 mmol/L   Potassium 3.6 3.5 - 5.1 mmol/L   Chloride 103 98 - 111 mmol/L   BUN 11 8 - 23 mg/dL   Creatinine, Ser 7.32 0.44 - 1.00 mg/dL   Glucose, Bld 202 (H) 70 - 99 mg/dL    Comment: Glucose reference range applies only to samples taken after fasting for at least 8 hours.    Calcium, Ion 1.13 (L) 1.15 - 1.40 mmol/L   TCO2 25 22 - 32 mmol/L   Hemoglobin 11.6 (L) 12.0 - 15.0 g/dL   HCT 54.2 (L) 70.6 - 23.7 %  I-Stat arterial blood gas, ED     Status: Abnormal   Collection Time: 21-Jun-2021  8:10 AM  Result Value Ref Range   pH, Arterial 7.506 (H) 7.350 - 7.450   pCO2 arterial 28.2 (L) 32.0 - 48.0 mmHg   pO2, Arterial 61 (L) 83.0 - 108.0 mmHg   Bicarbonate 22.3 20.0 - 28.0 mmol/L   TCO2 23 22 - 32 mmol/L   O2 Saturation 94.0 %   Acid-Base Excess 0.0 0.0 - 2.0 mmol/L   Sodium 142 135 - 145 mmol/L   Potassium 3.5 3.5 - 5.1 mmol/L   Calcium, Ion 1.10 (L) 1.15 - 1.40 mmol/L   HCT 31.0 (L) 36.0 - 46.0 %   Hemoglobin 10.5 (L) 12.0 - 15.0 g/dL   Patient temperature 62.8 F    Collection site Radial    Drawn by RT    Sample type ARTERIAL    CDG Chest Portable 1 View  Result Date: 06/21/21 CLINICAL DATA:  Intracranial hemorrhage.  Ventilator dependence. EXAM: PORTABLE CHEST 1 VIEW COMPARISON:  None. FINDINGS: 0654 hours. Endotracheal tube tip is 2.5 cm above the base of the carina. The NG tube passes into the stomach although the distal tip position is not included on the film. Lungs are clear bilaterally. The cardiopericardial silhouette is within normal limits for size. The visualized bony structures of the thorax show no acute abnormality. Telemetry leads overlie the chest. IMPRESSION: No active disease. Electronically Signed   By: Kennith Center M.D.   On: Jun 21, 2021 07:33   CT HEAD CODE STROKE WO CONTRAST  Result Date: 06-21-21 CLINICAL DATA:  Code stroke.  70 year old female. EXAM: CT HEAD WITHOUT CONTRAST TECHNIQUE: Contiguous axial images were obtained from the base of the skull through the vertex without intravenous contrast. COMPARISON:  None. FINDINGS: Brain: Large multicentric hyperdense  intra-axial hemorrhage in the right hemisphere including a component in the posterior right frontal and parietal lobes encompassing 51 by 31 x 45 mm (estimated volume 36  mL). And a 2nd, larger hemorrhage centered in the right temporal lobe and frontal operculum encompassing 62 by 54 by 55 mm (AP by transverse by CC), estimated volume 92 mL). Severe right hemisphere edema. Intraventricular extension, but subtotal effacement of the right lateral ventricle with leftward midline shift of 9 mm. The left lateral ventricle appears trapped with moderate IVH distending the atrium and left occipital horn. Trace right superior convexity subarachnoid hemorrhage. There is right uncal herniation. And effaced suprasellar, ambient, and prepontine cisterns with conspicuous hypodensity in the brainstem. Less pronounced left hemisphere edema. Third ventricle completely effaced. Fourth ventricle appears relatively normal. No definite cerebellar edema. No tonsillar herniation at this time. Vascular: Only mild Calcified atherosclerosis at the skull base. No suspicious intracranial vascular hyperdensity. Skull: Intact.  No acute or suspicious osseous lesion. Sinuses/Orbits: Small volume layering fluid in some of the paranasal sinuses. Tympanic cavities and mastoids are clear. Other: Visualized orbits and scalp soft tissues are within normal limits. Total score (0-10 with 10 being normal): Not applicable, severe intracranial hemorrhage. IMPRESSION: 1. Severe multicentric right hemisphere intra-axial hemorrhage, total intra-axial blood volume estimated at roughly 128 mL. 2. Leftward midline shift of 9 mm with left uncal herniation and partially effaced basilar cisterns. Severe edema in the right hemisphere and likely also the brainstem. 3. Effaced right lateral and 3rd ventricles with trapped left lateral ventricle and intraventricular hemorrhage. Small volume right hemisphere subarachnoid hemorrhage. 4. Critical Value/emergent results were called by telephone at the time of interpretation on 05/29/2021 at 0655 hours to Dr. Milon Dikes , who verbally acknowledged these results. Electronically Signed   By: Odessa Fleming M.D.   On: 06/15/2021 07:08     Review of Systems  Unable to perform ROS: Patient unresponsive  Blood pressure (!) 108/49, pulse (!) 58, temperature 97.9 F (36.6 C), resp. rate 16, height 5\' 4"  (1.626 m), weight 66.2 kg, SpO2 100 %. Estimated body mass index is 25.06 kg/m as calculated from the following:   Height as of this encounter: 5\' 4"  (1.626 m).   Weight as of this encounter: 66.2 kg.  Physical Exam Constitutional:      Appearance: She is normal weight. She is ill-appearing.     Interventions: She is intubated. Cervical collar in place.  HENT:     Head: Normocephalic and atraumatic.  Cardiovascular:     Rate and Rhythm: Regular rhythm. Bradycardia present.  Pulmonary:     Effort: Pulmonary effort is normal. She is intubated.     Breath sounds: Rhonchi present.  Abdominal:     General: There is no distension.     Palpations: Abdomen is soft.  Musculoskeletal:     Cervical back: Rigidity present.  Skin:    General: Skin is warm and dry.     Capillary Refill: Capillary refill takes less than 2 seconds.  Neurological:     Mental Status: She is unresponsive.     GCS: GCS eye subscore is 1. GCS verbal subscore is 1. GCS motor subscore is 4.     Comments: Withdraws to pain in RUE and RLE Right pupil 5 round nonreactive Left pupil 3 round sluggish    Assessment/Plan: Patient with large right hemispheric hemorrhage. CT angiogram to be performed shortly. Further recommendations will be made following this study.  , DNP, AGNP-C Nurse Practitioner  [facility] [address], [address], [place], [place] [address] P: [phone]    F: [phone]  06-18-2021, 8:36 AM

## 2021-06-07 NOTE — Progress Notes (Signed)
CT angiogram negative for aneurysm or vascular malformation.  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  Patient with a very large right multifocal lobar hemorrhage likely secondary to a mixture of hypertension and some degree of angiopathy.  Given the size then extended hemorrhage coupled with her marked mass effect and evidence of transtentorial herniation, I do not think that any type of surgical intervention is appropriate or likely to provide any clinical improvement.I will discuss things further with family later this morning.  Recommend comfort care for now.

## 2021-06-07 NOTE — ED Notes (Signed)
Daughter at bedside.

## 2021-06-07 NOTE — Progress Notes (Addendum)
STROKE TEAM PROGRESS NOTE   INTERVAL HISTORY 70 year old female with history of dyslipidemia, a resident of [place] visiting family locally, found down at home this morning. Last known well last night going to bed around 9 pm. CT head shows large and devastating right hemispheric intra-axial hemorrhage with total blood volume of nearly 130cc with uncal herniation and partial effacement.   Family would like everything done for now. Husband is driving in from [place].    Her daughter, Melissa Jacobson, is at the bedside.   Neurosurgery has evaluated patient, and per note, there is no planned surgical intervention, with recommendation for comfort care.  Blood pressure adequately controlled.  Neurological exam is quite poor with patient only reflexive posturing and not following commands Vitals:   06/22/2021 0845 06/05/2021 0900 05/28/2021 0921 05/30/2021 0930  BP: (!) 149/77 (!) 155/51 (!) 149/75 (!) 137/58  Pulse:  (!) 112 (!) 117 (!) 113  Resp: (!) 24 (!) 24 (!) 27 (!) 25  Temp:   100.2 F (37.9 C)   TempSrc:   Axillary   SpO2:  100% 100% 100%  Weight:   57.6 kg   Height:       CBC:  Recent Labs  Lab 06/11/2021 0651 06/13/2021 0702 06/06/2021 0810  WBC 12.5*  --   --   NEUTROABS 11.3*  --   --   HGB 11.4* 11.6* 10.5*  HCT 34.0* 34.0* 31.0*  MCV 89.0  --   --   PLT 183  --   --    Basic Metabolic Panel:  Recent Labs  Lab 06/14/2021 0651 05/27/2021 0702 06/04/2021 0810  NA 137 139 142  K 3.7 3.6 3.5  CL 103 103  --   CO2 22  --   --   GLUCOSE 182* 182*  --   BUN 11 11  --   CREATININE 0.65 0.50  --   CALCIUM 9.3  --   --     Lipid Panel: No results for input(s): CHOL, TRIG, HDL, CHOLHDL, VLDL, LDLCALC in the last 168 hours.  HgbA1c: No results for input(s): HGBA1C in the last 168 hours. Urine Drug Screen: No results for input(s): LABOPIA, COCAINSCRNUR, LABBENZ, AMPHETMU, THCU, LABBARB in the last 168 hours.  Alcohol Level  Recent Labs  Lab 06/21/2021 0651  ETH <10    IMAGING past 24  hours CT Angio Head W or Wo Contrast  Result Date: 06/11/2021 CLINICAL DATA:  Neuro deficit, acute, stroke suspected. Found down and unresponsive with large right cerebral hemorrhage on CT. EXAM: CT ANGIOGRAPHY HEAD AND NECK TECHNIQUE: Multidetector CT imaging of the head and neck was performed using the standard protocol during bolus administration of intravenous contrast. Multiplanar CT image reconstructions and MIPs were obtained to evaluate the vascular anatomy. Carotid stenosis measurements (when applicable) are obtained utilizing NASCET criteria, using the distal internal carotid diameter as the denominator. CONTRAST:  31mL OMNIPAQUE IOHEXOL 350 MG/ML SOLN COMPARISON:  None. FINDINGS: CTA NECK FINDINGS Aortic arch: Standard 3 vessel aortic arch with minimal atherosclerotic plaque. Widely patent arch vessel origins. Right carotid system: Patent with a small amount of calcified and soft plaque at the carotid bifurcation. No evidence of a significant stenosis or dissection. Left carotid system: Patent without evidence of stenosis, dissection, or significant atherosclerosis. Vertebral arteries: Patent without evidence of stenosis, dissection, or significant atherosclerosis. Dominant left vertebral artery. Skeleton: No acute osseous abnormality or suspicious osseous lesion. Severe right facet arthrosis at C4-5 with grade 1 anterolisthesis. Mild-to-moderate disc degeneration at  C5-6 and C6-7. Other neck: No evidence of cervical lymphadenopathy or mass. Upper chest: Endotracheal tube terminates above the carina. Partially visualized enteric tube. Clear lung apices. Review of the MIP images confirms the above findings CTA HEAD FINDINGS Anterior circulation: The internal carotid arteries are patent from skull base to carotid termini with mild atherosclerotic plaque not resulting in a significant stenosis. ACAs and MCAs are patent without evidence of a proximal branch occlusion or flow limiting A1 or M1 stenosis.  There is asymmetric irregular narrowing and attenuation of right MCA branch vessels. No aneurysm is identified, and no CTA spot sign is evident in the right cerebral hemispheric hemorrhage. Posterior circulation: The intracranial vertebral arteries are widely patent to the basilar. The basilar artery is widely patent. There is a large right posterior communicating artery with hypoplastic right P1 segment. Both PCAs are patent without evidence of a significant proximal stenosis. No aneurysm is identified. Venous sinuses: As permitted by contrast timing, patent. Anatomic variants: Fetal right PCA. Review of the MIP images confirms the above findings IMPRESSION: 1. Mild atherosclerosis in the head and neck without a large vessel occlusion, flow limiting proximal stenosis, or aneurysm. 2. Asymmetric attenuation and irregular narrowing of right MCA branch vessels. 3. Aortic Atherosclerosis (ICD10-I70.0). Electronically Signed   By: Sebastian Ache M.D.   On: Jul 01, 2021 09:07   CT Angio Neck W and/or Wo Contrast  Result Date: 07-01-21 CLINICAL DATA:  Neuro deficit, acute, stroke suspected. Found down and unresponsive with large right cerebral hemorrhage on CT. EXAM: CT ANGIOGRAPHY HEAD AND NECK TECHNIQUE: Multidetector CT imaging of the head and neck was performed using the standard protocol during bolus administration of intravenous contrast. Multiplanar CT image reconstructions and MIPs were obtained to evaluate the vascular anatomy. Carotid stenosis measurements (when applicable) are obtained utilizing NASCET criteria, using the distal internal carotid diameter as the denominator. CONTRAST:  70mL OMNIPAQUE IOHEXOL 350 MG/ML SOLN COMPARISON:  None. FINDINGS: CTA NECK FINDINGS Aortic arch: Standard 3 vessel aortic arch with minimal atherosclerotic plaque. Widely patent arch vessel origins. Right carotid system: Patent with a small amount of calcified and soft plaque at the carotid bifurcation. No evidence of a  significant stenosis or dissection. Left carotid system: Patent without evidence of stenosis, dissection, or significant atherosclerosis. Vertebral arteries: Patent without evidence of stenosis, dissection, or significant atherosclerosis. Dominant left vertebral artery. Skeleton: No acute osseous abnormality or suspicious osseous lesion. Severe right facet arthrosis at C4-5 with grade 1 anterolisthesis. Mild-to-moderate disc degeneration at C5-6 and C6-7. Other neck: No evidence of cervical lymphadenopathy or mass. Upper chest: Endotracheal tube terminates above the carina. Partially visualized enteric tube. Clear lung apices. Review of the MIP images confirms the above findings CTA HEAD FINDINGS Anterior circulation: The internal carotid arteries are patent from skull base to carotid termini with mild atherosclerotic plaque not resulting in a significant stenosis. ACAs and MCAs are patent without evidence of a proximal branch occlusion or flow limiting A1 or M1 stenosis. There is asymmetric irregular narrowing and attenuation of right MCA branch vessels. No aneurysm is identified, and no CTA spot sign is evident in the right cerebral hemispheric hemorrhage. Posterior circulation: The intracranial vertebral arteries are widely patent to the basilar. The basilar artery is widely patent. There is a large right posterior communicating artery with hypoplastic right P1 segment. Both PCAs are patent without evidence of a significant proximal stenosis. No aneurysm is identified. Venous sinuses: As permitted by contrast timing, patent. Anatomic variants: Fetal right PCA. Review of the  MIP images confirms the above findings IMPRESSION: 1. Mild atherosclerosis in the head and neck without a large vessel occlusion, flow limiting proximal stenosis, or aneurysm. 2. Asymmetric attenuation and irregular narrowing of right MCA branch vessels. 3. Aortic Atherosclerosis (ICD10-I70.0). Electronically Signed   By: Sebastian AcheAllen  Grady M.D.    On: 2021/05/14 09:07   DG Chest Portable 1 View  Result Date: 19-Nov-2020 CLINICAL DATA:  Intracranial hemorrhage.  Ventilator dependence. EXAM: PORTABLE CHEST 1 VIEW COMPARISON:  None. FINDINGS: 0654 hours. Endotracheal tube tip is 2.5 cm above the base of the carina. The NG tube passes into the stomach although the distal tip position is not included on the film. Lungs are clear bilaterally. The cardiopericardial silhouette is within normal limits for size. The visualized bony structures of the thorax show no acute abnormality. Telemetry leads overlie the chest. IMPRESSION: No active disease. Electronically Signed   By: Kennith CenterEric  Mansell M.D.   On: 2021/05/14 07:33   CT HEAD CODE STROKE WO CONTRAST  Result Date: 19-Nov-2020 CLINICAL DATA:  Code stroke.  70 year old female. EXAM: CT HEAD WITHOUT CONTRAST TECHNIQUE: Contiguous axial images were obtained from the base of the skull through the vertex without intravenous contrast. COMPARISON:  None. FINDINGS: Brain: Large multicentric hyperdense intra-axial hemorrhage in the right hemisphere including a component in the posterior right frontal and parietal lobes encompassing 51 by 31 x 45 mm (estimated volume 36 mL). And a 2nd, larger hemorrhage centered in the right temporal lobe and frontal operculum encompassing 62 by 54 by 55 mm (AP by transverse by CC), estimated volume 92 mL). Severe right hemisphere edema. Intraventricular extension, but subtotal effacement of the right lateral ventricle with leftward midline shift of 9 mm. The left lateral ventricle appears trapped with moderate IVH distending the atrium and left occipital horn. Trace right superior convexity subarachnoid hemorrhage. There is right uncal herniation. And effaced suprasellar, ambient, and prepontine cisterns with conspicuous hypodensity in the brainstem. Less pronounced left hemisphere edema. Third ventricle completely effaced. Fourth ventricle appears relatively normal. No definite cerebellar  edema. No tonsillar herniation at this time. Vascular: Only mild Calcified atherosclerosis at the skull base. No suspicious intracranial vascular hyperdensity. Skull: Intact.  No acute or suspicious osseous lesion. Sinuses/Orbits: Small volume layering fluid in some of the paranasal sinuses. Tympanic cavities and mastoids are clear. Other: Visualized orbits and scalp soft tissues are within normal limits. Total score (0-10 with 10 being normal): Not applicable, severe intracranial hemorrhage. IMPRESSION: 1. Severe multicentric right hemisphere intra-axial hemorrhage, total intra-axial blood volume estimated at roughly 128 mL. 2. Leftward midline shift of 9 mm with left uncal herniation and partially effaced basilar cisterns. Severe edema in the right hemisphere and likely also the brainstem. 3. Effaced right lateral and 3rd ventricles with trapped left lateral ventricle and intraventricular hemorrhage. Small volume right hemisphere subarachnoid hemorrhage. 4. Critical Value/emergent results were called by telephone at the time of interpretation on 19-Nov-2020 at 0655 hours to Dr. Milon DikesASHISH ARORA , who verbally acknowledged these results. Electronically Signed   By: Odessa FlemingH  Hall M.D.   On: 2021/05/14 07:08    PHYSICAL EXAM  Neurological exam patient is intubated and not sedated Mental status: Obtunded, no eye-opening to voice or noxious stimulation. When forced open left eye wanders without focus.  Nonverbal.  Does not follow any commands Cranials: Pupils are 3 mm sluggishly reactive, no gaze preference or deviation, has occasional cytocide roving eye movements.  Does not blink to threat from either side, face appears symmetric. Motor examination:  No spontaneous movement initially.  To noxious stimulation, at best localization versus flexor.  Decorticate posturing intermittently to sternal rub in both upper extremities Sensory exam: As above Coordination cannot be assessed NIH stroke scale 29 on arrival at 6:33 AM.  Unable to perform rest of exam.   ASSESSMENT/PLAN Ms. Melissa Jacobson is a 70 y.o. female with history of dyslipidemia, found her down on the ground unresponsive. She went to bed last night at 9 PM and at 11 PM family heard some loud thud.  They did not make much of it but this morning she would not wake up.  They called EMS who found her to be incontinent, vomited upon herself and brought her in as a code stroke.  Systolic blood pressure 220s on site.  EMS also noted some right-sided gaze deviation which had resolved on the way. Poor examination with GCS 7 at best on arrival that quickly converted to a GCS of 3.  Emergent intubation. Noncontrasted CT was done prior to intubation. Noncontrasted CT shows multifocal ICH, IVH, obliteration of the basal ventricular system and compression of brainstem.   Daughter at bedside reports that she was in her usual state of health before going to bed yesterday.    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   Stroke, hemorrhagic multifocal ICH, IVH :  right  infarct   CTH:  Severe multicentric right hemisphere intra-axial hemorrhage,t otal intra-axial blood volume estimated at roughly 128 mL. 2. Leftward midline shift of 9 mm with left uncal herniation and partially effaced basilar cisterns. Severe edema in the right hemisphere and likely also the brainstem. 3. Effaced right lateral and 3rd ventricles with trapped left Lateral ventricle and intraventricular hemorrhage  2D Echo pending   LDL No results found for requested labs within last 27062 hours. HgbA1c No results found for requested labs within last 37628 hours. VTE prophylaxis - scd    Diet   Diet NPO time specified   No antithrombotic prior to admission, now on No antithrombotic.   Therapy recommendations:  pending Disposition:  pending  Hypertension Home meds:  none Unstable Permissive hypertension (OK if < 220/120) but gradually  normalize in 5-7 days Long-term BP goal normotensive  Hyperlipidemia Home meds:  lipitor 20mg  ,   LDL No results found for requested labs within last hours., goal < 70   HgbA1c No results found for requested labs within last 31517 hours., goal < 7.0 CBGs Recent Labs    04-Jul-2021 0635  GLUCAP 170*    SSI  Other Stroke Risk Factors  Advanced Age >/= 66      Hospital day # 0 I have personally obtained history,examined this patient, reviewed notes, independently viewed imaging studies, participated in medical decision making and plan of care.ROS completed by me personally and pertinent positives fully documented  I have made any additions or clarifications directly to the above note. Agree with note above.  Patient presented with sudden onset of unresponsiveness and CT scan shows multifocal parenchymal intracerebral hemorrhage with significant midline shift mass-effect and brain herniation with a very poor neurological exam that is not compatible with any meaningful survival.  I had a long discussion with the patient daughter at the bedside regarding her poor prognosis on also shared opinion from neurosurgeon and critical care medicine.  Agree with DNR and transition to full comfort care after arrival of her husband and family later today.  Continue ventilatory support for now  and hypertonic saline.  Discussed with Dr. Merrily Pew critical care medicine This patient is critically ill and at significant risk of neurological worsening, death and care requires constant monitoring of vital signs, hemodynamics,respiratory and cardiac monitoring, extensive review of multiple databases, frequent neurological assessment, discussion with family, other specialists and medical decision making of high complexity.I have made any additions or clarifications directly to the above note.This critical care time does not reflect procedure time, or teaching time or supervisory time of PA/NP/Med Resident etc but could  involve care discussion time.  I spent 30 minutes of neurocritical care time  in the care of  this patient.      Delia Heady, MD Medical Director [facility] Pager: [phone] 06/03/2021 5:48 PM     To contact Stroke Continuity provider, please refer to [email or web address]. After hours, contact General Neurology

## 2021-06-07 NOTE — Progress Notes (Signed)
PCCM INTERVAL PROGRESS NOTE  Patient's husband now at bedside from [place]. I answered his questions to the best of my ability. They are waiting for more family to arrive before making any additional decisions regarding her care. I will be available overnight to answer any additional questions.     Joneen Roach, AGACNP-BC [facility]  See Amion for personal pager PCCM on call pager [phone] until 7pm. Please call Elink 7p-7a. [phone]  06-08-21 8:21 PM

## 2021-06-07 NOTE — Progress Notes (Signed)
BP monitored and Cleviprex gtt titrated during transport to/from CT and 4 North per protocol. Unable to document rate changes while moving pt.

## 2021-06-07 NOTE — Progress Notes (Signed)
Situation: Chaplain Medinas-Lockley responding to page for family requesting chaplain for pt Jamilya Sarrazin.  Background: Facts: Per family's understanding of the medical team's assessment, "it doesn't look like she's going to make it." Ms. Minyon has "a brain bleed" and during the visit, an NP was discussing options with family. At time of visit, Ms. Yosselin was intubated and appeared unable to communicate. Family: Ms. Georgetta daughter, Edman Circle (spelling?), was present at time of visit. She called her father (Ms. Kaislee's husband) and sister, Martie Lee (Ms. Shaletta daughter), during the visit. Ms. Edman Circle shared that Ms. Shanea has 3 granddaughters. Ms. Edman Circle also shared that Ms. Debe's grandmother had an aneurism. Feelings: Ms. Edman Circle appeared tearful and her family sounded tearful over the phone. Family was encouraged to speak to Ms. Tahani and shared how much they love her, are praying for her, and encouraging her to "hang in there". Family discussed goals of care and expressed that Ms. Margaret would "want to live even if she was unable to walk". Mr. Klutts expressed "She wouldn't go to a nursing home; I will take care of her." Faith: Ms. Edman Circle shared that Ms. Wylee identifies as Passenger transport manager". She shared that Ms. Reni "doesn't live here" as she is "from [place]" and did not request a priest at this time. Ms. Edman Circle and Ms. Sabrina requested prayer.  Actions & Assessments: Chaplain Medinas-Lockley offered prayer, compassionate presence, and assessed for any faith-specific spiritual needs.  Family thanked this chaplain for the visit and prayer and expressed an understanding that chaplains are available for follow-up care as requested.  Recommendations: For Chaplains: Though not requested in this visit, be ready to seek faith-specific community clergy [employer] priest) if needed at a later time. For Staff: Continue to seek understanding about how family understands how Ms. Shruthi's faith impacts her  goals of care. Be aware and ready to discuss changes in goals of care as needed.  Chaplain remains available for follow-up spiritual/emotional support as needed.  Rev. Mayme Genta, MDiv     Jul 07, 2021 0800  Clinical Encounter Type  Visited With Patient and family together  Visit Type Initial;ED;Spiritual support  Referral From Family  Spiritual Encounters  Spiritual Needs Prayer

## 2021-06-07 NOTE — Progress Notes (Signed)
3 percent saline bolus started. BP being managed with Cleviprex.

## 2021-06-07 NOTE — ED Provider Notes (Signed)
[facility] EMERGENCY DEPARTMENT Provider Note   CSN: [id number] Arrival date & time: 30-Jun-2021  9678     History No chief complaint on file.   Melissa Jacobson is a 70 y.o. female.  The history is provided by the EMS personnel. The history is limited by the condition of the patient (acuity of condition).  Cerebrovascular Accident This is a new problem. The current episode started 6 to 12 hours ago (family heard a thud at 11 pm). The problem occurs constantly. The problem has not changed since onset.Nothing aggravates the symptoms. Nothing relieves the symptoms. She has tried nothing for the symptoms. The treatment provided no relief.  Patient was last seen normal at 9 pm, family heard a "thud".  Found with vomitus around her.      No past medical history on file.  There are no problems to display for this patient.     OB History   No obstetric history on file.     No family history on file.     Home Medications Prior to Admission medications   Not on File    Allergies    Patient has no allergy information on record.  Review of Systems   Review of Systems  Unable to perform ROS: Acuity of condition  HENT:  Negative for facial swelling.   Musculoskeletal:  Negative for neck pain.  Skin:  Negative for rash.   Physical Exam Updated Vital Signs There were no vitals taken for this visit.  Physical Exam Vitals and nursing note reviewed.  Constitutional:      Appearance: She is well-developed. She is not diaphoretic.  HENT:     Head: Normocephalic.     Nose: Nose normal.     Mouth/Throat:     Mouth: Mucous membranes are moist.     Pharynx: Oropharynx is clear.  Eyes:     Conjunctiva/sclera: Conjunctivae normal.  Cardiovascular:     Rate and Rhythm: Regular rhythm. Tachycardia present.     Pulses: Normal pulses.     Heart sounds: Normal heart sounds.  Pulmonary:     Breath sounds: Rhonchi present.     Comments: Rhonchi with bagging RR 4  on arrival  Abdominal:     General: Abdomen is flat. Bowel sounds are normal.     Palpations: Abdomen is soft.  Musculoskeletal:     Right lower leg: No edema.     Left lower leg: No edema.  Lymphadenopathy:     Cervical: No cervical adenopathy.  Skin:    General: Skin is warm and dry.     Capillary Refill: Capillary refill takes less than 2 seconds.  Neurological:     GCS: GCS eye subscore is 1. GCS verbal subscore is 1. GCS motor subscore is 4.  Psychiatric:     Comments: Unable     ED Results / Procedures / Treatments   Labs (all labs ordered are listed, but only abnormal results are displayed) Results for orders placed or performed during the hospital encounter of 06/30/2021  CBG monitoring, ED  Result Value Ref Range   Glucose-Capillary 170 (H) 70 - 99 mg/dL  I-stat chem 8, ED  Result Value Ref Range   Sodium 139 135 - 145 mmol/L   Potassium 3.6 3.5 - 5.1 mmol/L   Chloride 103 98 - 111 mmol/L   BUN 11 8 - 23 mg/dL   Creatinine, Ser 9.38 0.44 - 1.00 mg/dL   Glucose, Bld 101 (H) 70 - 99 mg/dL  Calcium, Ion 1.13 (L) 1.15 - 1.40 mmol/L   TCO2 25 22 - 32 mmol/L   Hemoglobin 11.6 (L) 12.0 - 15.0 g/dL   HCT 54.4 (L) 92.0 - 10.0 %   No results found.   EKG  EKG Interpretation  Date/Time:  Monday June 07 2021 06:49:24 EDT Ventricular Rate:  83 PR Interval:  121 QRS Duration: 99 QT Interval:  419 QTC Calculation: 493 R Axis:   74 Text Interpretation: Sinus rhythm ST depr, consider ischemia inferiorly and laterally Confirmed by Nicanor Alcon, Dalary Hollar ([id number]) on 06/22/2021 6:58:37 AM         Radiology No results found.  Procedures Procedures   Medications Ordered in ED Medications  etomidate (AMIDATE) injection (20 mg Intravenous Given 06/15/2021 0643)  rocuronium [facility]) injection (80 mg Intravenous Given 06/22/2021 0643)  labetalol (NORMODYNE) injection (10 mg Intravenous Given 05/31/2021 7588)    ED Course  I have reviewed the triage vital signs and the nursing  notes.  Pertinent labs & imaging results that were available during my care of the patient were reviewed by me and considered in my medical decision making (see chart for details).  651 am: Case d/w Dr. Dutch Quint of neurosurgery who will see the patient   MDM   Amount and/or Complexity of Data Reviewed Clinical lab tests: ordered and reviewed Tests in the radiology section of CPT: ordered and reviewed Discussion of test results with the performing providers: yes Decide to obtain previous medical records or to obtain history from someone other than the patient: yes Obtain history from someone other than the patient: yes Review and summarize past medical records: yes Independent visualization of images, tracings, or specimens: yes  Risk of Complications, Morbidity, and/or Mortality Presenting problems: high Diagnostic procedures: high Management options: high  Critical Care Total time providing critical care: 30-74 minutes (ICH with multiple consults)  MDM Reviewed: nursing note and vitals Interpretation: CT scan, x-ray and ECG Total time providing critical care: 30-74 minutes (ICH with multiple consults). This excludes time spent performing separately reportable procedures and services. Consults: neurosurgery and neurology     Patient likely aspirated and will be covered empirically with antibiotics.    Final Clinical Impression(s) / ED Diagnoses ICH.  admit     Melissa Madrid, MD 06/02/2021 [phone]

## 2021-06-07 NOTE — Progress Notes (Signed)
Stroke RN to bedside to assist management of this pt.

## 2021-06-07 NOTE — Progress Notes (Signed)
Report called to 4 [facility]. Neurosurgery says to go ahead and take pt to 4 [place].

## 2021-06-07 NOTE — Progress Notes (Signed)
Dr Merrily Pew CCM at bedside

## 2021-06-07 NOTE — Code Documentation (Signed)
Stroke Response Nurse Documentation Code Documentation  Melissa Jacobson is a 71 y.o. female arriving to [facility] ED via [facility] on 9/12 with unknown past medical hx. On No antithrombotic. Code stroke was activated by EMS.   Patient from home where she was LKW at 2100 and now complaining of Right facial droop, unresponsiveness.   Stroke team at the bedside on patient arrival. Labs drawn and patient cleared for CT by Dr. Nicanor Alcon. Patient to CT with team. NIHSS 35, see documentation for details and code stroke times. Patient with decreased LOC, disoriented, not following commands, bilateral hemianopia, right facial droop, bilateral arm weakness, bilateral leg weakness, bilateral decreased sensation, Global aphasia , dysarthria , and bilateral neglect on exam. The following imaging was completed:  CT, CTA head and neck. Patient is not a candidate for IV Thrombolytic due to hemorrhage. Patient is not a candidate for IR due to ICH.     Bedside handoff with ED RN Sofie.    Rose Fillers  Rapid Response RN

## 2021-06-08 ENCOUNTER — Inpatient Hospital Stay (HOSPITAL_COMMUNITY): Payer: Medicare PPO

## 2021-06-08 DIAGNOSIS — Z66 Do not resuscitate: Secondary | ICD-10-CM | POA: Diagnosis not present

## 2021-06-08 DIAGNOSIS — J9690 Respiratory failure, unspecified, unspecified whether with hypoxia or hypercapnia: Secondary | ICD-10-CM

## 2021-06-08 DIAGNOSIS — I619 Nontraumatic intracerebral hemorrhage, unspecified: Secondary | ICD-10-CM

## 2021-06-08 DIAGNOSIS — Z515 Encounter for palliative care: Secondary | ICD-10-CM | POA: Diagnosis not present

## 2021-06-08 DIAGNOSIS — Z4659 Encounter for fitting and adjustment of other gastrointestinal appliance and device: Secondary | ICD-10-CM

## 2021-06-08 DIAGNOSIS — J9601 Acute respiratory failure with hypoxia: Secondary | ICD-10-CM | POA: Diagnosis not present

## 2021-06-08 DIAGNOSIS — I609 Nontraumatic subarachnoid hemorrhage, unspecified: Secondary | ICD-10-CM | POA: Diagnosis not present

## 2021-06-08 LAB — SODIUM
Sodium: 152 mmol/L — ABNORMAL HIGH (ref 135–145)
Sodium: 158 mmol/L — ABNORMAL HIGH (ref 135–145)

## 2021-06-08 MED ORDER — GLYCOPYRROLATE 1 MG PO TABS
1.0000 mg | ORAL_TABLET | ORAL | Status: DC | PRN
  Filled 2021-06-08: qty 1

## 2021-06-08 MED ORDER — HALOPERIDOL LACTATE 2 MG/ML PO CONC
0.5000 mg | ORAL | Status: DC | PRN
  Filled 2021-06-08: qty 0.3

## 2021-06-08 MED ORDER — GLYCOPYRROLATE 0.2 MG/ML IJ SOLN: 0.2000 mg | INTRAMUSCULAR | Status: DC | PRN

## 2021-06-08 MED ORDER — MORPHINE SULFATE (PF) 4 MG/ML IV SOLN
4.0000 mg | INTRAVENOUS | Status: DC | PRN
  Administered 2021-06-08 (×2): 4 mg via INTRAVENOUS
  Filled 2021-06-08 (×2): qty 1

## 2021-06-08 MED ORDER — ONDANSETRON 4 MG PO TBDP: 4.0000 mg | ORAL_TABLET | Freq: Four times a day (QID) | ORAL | Status: DC | PRN

## 2021-06-08 MED ORDER — MORPHINE SULFATE (PF) 4 MG/ML IV SOLN: 4.0000 mg | INTRAVENOUS | Status: DC | PRN

## 2021-06-08 MED ORDER — MORPHINE 100MG IN NS 100ML (1MG/ML) PREMIX INFUSION
1.0000 mg/h | INTRAVENOUS | Status: DC
  Administered 2021-06-08: 1 mg/h via INTRAVENOUS
  Filled 2021-06-08: qty 100

## 2021-06-08 MED ORDER — STROKE: EARLY STAGES OF RECOVERY BOOK
Status: AC
  Filled 2021-06-08: qty 1

## 2021-06-08 MED ORDER — ONDANSETRON HCL 4 MG/2ML IJ SOLN: 4.0000 mg | Freq: Four times a day (QID) | INTRAMUSCULAR | Status: DC | PRN

## 2021-06-08 MED ORDER — MIDAZOLAM HCL 2 MG/2ML IJ SOLN
2.0000 mg | INTRAMUSCULAR | Status: DC | PRN
  Administered 2021-06-08 (×3): 2 mg via INTRAVENOUS
  Filled 2021-06-08 (×3): qty 2

## 2021-06-08 MED ORDER — POLYVINYL ALCOHOL 1.4 % OP SOLN
1.0000 [drp] | Freq: Four times a day (QID) | OPHTHALMIC | Status: DC | PRN
  Filled 2021-06-08: qty 15

## 2021-06-08 MED ORDER — MORPHINE BOLUS VIA INFUSION
2.0000 mg | INTRAVENOUS | Status: DC | PRN
  Administered 2021-06-08 – 2021-06-09 (×2): 2 mg via INTRAVENOUS
  Filled 2021-06-08: qty 2

## 2021-06-08 MED ORDER — HALOPERIDOL 0.5 MG PO TABS
0.5000 mg | ORAL_TABLET | ORAL | Status: DC | PRN
  Filled 2021-06-08: qty 1

## 2021-06-08 MED ORDER — BIOTENE DRY MOUTH MT LIQD: 15.0000 mL | OROMUCOSAL | Status: DC | PRN

## 2021-06-08 MED ORDER — HALOPERIDOL LACTATE 5 MG/ML IJ SOLN: 0.5000 mg | INTRAMUSCULAR | Status: DC | PRN

## 2021-06-08 MED ORDER — GLYCOPYRROLATE 0.2 MG/ML IJ SOLN
0.1000 mg | Freq: Once | INTRAMUSCULAR | Status: AC
  Administered 2021-06-08: 0.1 mg via INTRAVENOUS
  Filled 2021-06-08: qty 1

## 2021-06-08 NOTE — Progress Notes (Signed)
STROKE TEAM PROGRESS NOTE   INTERVAL HISTORY Patient's neurological condition remains poor and she remains intubated for respiratory failure and comatose unresponsive.  She is having spontaneous intermittent decorticate posturing.  Repeat CT scan of the head this morning shows expected cytotoxic edema with midline shift measuring 12 mm and slight decrease in intraventricular blood but no hematoma expansion and stable hemorrhage size.  There continues to be significant hypodensity of the brainstem and diencephalon.   Her husband and daughter, [name], are at the bedside.   Also spoke to her cardiologist Dr. Tomie China over the phone and brother via FaceTime.  I informed them about patient's nonsurvivable massive brain hemorrhage and horrible prognosis and that the best if prolong life support discontinued she would survive to live in a persistent vegetative state without significant improvement. Blood pressure adequately controlled.  She remains on hypertonic saline and serum sodium is 152.  Neurological exam continues to be quite poor with patient only reflexive posturing and not following commands Vitals:   06/08/21 1113 06/08/21 1117 06/08/21 1200 06/08/21 1300  BP:   137/61 (!) 121/105  Pulse:   (!) 106 (!) 104  Resp:   (!) 21 20  Temp:  99.2 F (37.3 C)    TempSrc:  Axillary    SpO2: 99%  99% 99%  Weight:      Height:       CBC:  Recent Labs  Lab 06/06/2021 0651 06/08/2021 0702 05/31/2021 0810  WBC 12.5*  --   --   NEUTROABS 11.3*  --   --   HGB 11.4* 11.6* 10.5*  HCT 34.0* 34.0* 31.0*  MCV 89.0  --   --   PLT 183  --   --    Basic Metabolic Panel:  Recent Labs  Lab 05/28/2021 0651 06/21/2021 0702 06/08/2021 0810 06/19/2021 0818 06/08/21 0335 06/08/21 1211  NA 137 139 142   < > 152* 158*  K 3.7 3.6 3.5  --   --   --   CL 103 103  --   --   --   --   CO2 22  --   --   --   --   --   GLUCOSE 182* 182*  --   --   --   --   BUN 11 11  --   --   --   --   CREATININE 0.65 0.50  --   --    --   --   CALCIUM 9.3  --   --   --   --   --    < > = values in this interval not displayed.    Lipid Panel:  Recent Labs  Lab 06/15/2021 0651  CHOL 131  TRIG 87  HDL 54  CHOLHDL 2.4  VLDL 17  LDLCALC 60    HgbA1c:  Recent Labs  Lab 06/08/2021 0651  HGBA1C 5.3   Urine Drug Screen: No results for input(s): LABOPIA, COCAINSCRNUR, LABBENZ, AMPHETMU, THCU, LABBARB in the last 168 hours.  Alcohol Level  Recent Labs  Lab 05/31/2021 0651  ETH <10    IMAGING past 24 hours CT HEAD WO CONTRAST ( )  Result Date: 06/08/2021 CLINICAL DATA:  Stroke follow-up. EXAM: CT HEAD WITHOUT CONTRAST TECHNIQUE: Contiguous axial images were obtained from the base of the skull through the vertex without intravenous contrast. COMPARISON:  Head CT from yesterday FINDINGS: Brain: Large parenchymal hemorrhage in the right cerebellum which is difficult to reproducibly quantify given the irregular  shape and involvement spanning the anterior temporal lobe to frontal parietal vertex. There has been some evolution with low-density now seen where closest to the right lateral ventricle, but no increase. Increase in adjacent vasogenic edema. Subarachnoid and intraventricular clot. The subarachnoid blood appears stable and the lateral intraventricular clot has diminished. Midline shift measures 12 mm with preferential dilatation of the left lateral ventricle and effacement of the right lateral ventricle. Uncal and subfalcine herniation without complicating infarcts seen. Vascular: Stable Skull: No acute finding Sinuses/Orbits: No acute finding IMPRESSION: 1. No evidence of rebleed. 2. Decreasing intraventricular clot. 3. Expected increased vasogenic edema. Midline shift measures 12 mm and there is a degree of left lateral ventricular entrapment. Electronically Signed   By: Tiburcio Pea M.D.   On: 06/08/2021 08:59    PHYSICAL EXAM  Neurological exam patient is intubated and not sedated Mental status: Obtunded, no  eye-opening to voice or noxious stimulation. When forced open left eye wanders without focus.  Nonverbal.  Does not follow any commands Cranials: Pupils are 3 mm sluggishly reactive, no gaze preference or deviation, has occasional side to side  roving eye movements.  Does not blink to threat from either side, face appears symmetric. Motor examination: No spontaneous movement initially.  Intermittent spontaneous decorticate posturing which increases in response y to sternal rub in both upper extremities Sensory exam: As above Coordination cannot be assessed Gait deferred ASSESSMENT/PLAN Ms. Wynell Halberg is a 70 y.o. female with history of dyslipidemia, found her down on the ground unresponsive. She went to bed last night at 9 PM and at 11 PM family heard some loud thud.  They did not make much of it but this morning she would not wake up.  They called EMS who found her to be incontinent, vomited upon herself and brought her in as a code stroke.  Systolic blood pressure 220s on site.  EMS also noted some right-sided gaze deviation which had resolved on the way. Poor examination with GCS 7 at best on arrival that quickly converted to a GCS of 3.  Emergent intubation. Noncontrasted CT was done prior to intubation. Noncontrasted CT shows multifocal ICH, IVH, obliteration of the basal ventricular system and compression of brainstem.   Daughter at bedside reports that she was in her usual state of health before going to bed yesterday.    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   Stroke, hemorrhagic multifocal ICH, IVH : Etiology hypertensive versus amyloid angiopathy CTH:  Severe multicentric right hemisphere intra-axial hemorrhage,t otal intra-axial blood volume estimated at roughly 128 mL. 2. Leftward midline shift of 9 mm with left uncal herniation and partially effaced basilar cisterns. Severe edema in the right hemisphere and likely  also the brainstem. 3. Effaced right lateral and 3rd ventricles with trapped left Lateral ventricle and intraventricular hemorrhage  2D Echo pending   LDL No results found for requested labs within last 44010 hours. HgbA1c No results found for requested labs within last 27253 hours. VTE prophylaxis - scd    Diet   Diet NPO time specified   No antithrombotic prior to admission, now on No antithrombotic.   Therapy recommendations:  pending Disposition:  pending  Hypertension Home meds:  none Unstable Permissive hypertension (OK if < 220/120) but gradually normalize in 5-7 days Long-term BP goal normotensive  Hyperlipidemia Home meds:  lipitor 20mg  ,   LDL No results found for requested labs within last hours., goal <  70   HgbA1c No results found for requested labs within last 76283 hours., goal < 7.0 CBGs Recent Labs    06/17/2021 0635  GLUCAP 170*    SSI  Other Stroke Risk Factors  Advanced Age >/= 22      Hospital day # 1 Patient's neurological exam remains expectedly poor with no meaningful improvement and repeat imaging showing stable hematoma size but with persistent cytotoxic edema and significant midline shift and brain herniation and brainstem edema.  She is unlikely to survive this event with prolonged life support and may end up in a persistent vegetative state and the best scenario.  Marland KitchenShe has a very poor neurological exam that is not compatible with any meaningful survival.  I had a long discussion with the her husband and daughter at the bedside regarding her poor prognosis on also shared opinion from neurosurgeon and critical care medicine.  Agree with DNR and family needs more time to transition to full comfort care and will let us know later today.  Continue ventilatory support for now and hypertonic saline but drip rate to 60 cc an hour..  Discussed with Dr. Merrily Pew critical care medicine This patient is critically ill and at significant risk of  neurological worsening, death and care requires constant monitoring of vital signs, hemodynamics,respiratory and cardiac monitoring, extensive review of multiple databases, frequent neurological assessment, discussion with family, other specialists and medical decision making of high complexity.I have made any additions or clarifications directly to the above note.This critical care time does not reflect procedure time, or teaching time or supervisory time of PA/NP/Med Resident etc but could involve care discussion time.  I spent 35 minutes of neurocritical care time  in the care of  this patient.      Delia Heady, MD Medical Director [facility] Stroke Center Pager: [phone] 06/08/2021 2:12 PM     To contact Stroke Continuity provider, please refer to [email or web address]. After hours, contact General Neurology

## 2021-06-08 NOTE — Procedures (Signed)
Extubation Procedure Note  Patient Details:   Name: Lashawnna Lambrecht DOB: 19-May-1951 MRN: [id number]   Airway Documentation:    Vent end date: 06/08/21 Vent end time: 1430   Evaluation  O2 sats: stable throughout Complications: No apparent complications Patient did tolerate procedure well. Bilateral Breath Sounds: Clear   No, currently acceptable, one way extubation  Toula Moos 06/08/2021, 2:34 PM

## 2021-06-08 NOTE — Progress Notes (Signed)
NAME:  Melissa Jacobson, MRN:  [id number], DOB:  Feb 07, 1951, LOS: 1 ADMISSION DATE:  06/06/2021, CONSULTATION DATE: 06/17/2021 ALT 71 REFERRING MD:  Veatrice Kells, MD CHlIEF COMPLAINT: Unresponsiveness  History of Present Illness:  70 year old female with hypertension and hyperlipidemia who was visiting from [place] to her daughter in [place].  Last well-known was 9 PM last night, her daughter heard tired around 36 PM, thought it was noise from neighbors.  She found her mother around 5:30 AM covered in vomitus and unresponsive, then called EMS and she was brought in here. On arrival stroke team was called, patient had CT head which showed large intraparenchymal hemorrhage with IVH, she was intubated for airway protection and possible aspiration.  She was started on 3% saline after giving mannitol bolus.  PCCM was consulted for evaluation and help with management.  Pertinent  Medical History  Hypertension Hyperlipidemia   Significant Hospital Events: Including procedures, antibiotic start and stop dates in addition to other pertinent events     Interim History / Subjective:  Minimally responsive with exception of intermittent withdrawal to pain lower extremities.  Has decorticate posturing upper extremities with stimulation and oral suctioning. No sedation. Family requesting repeat CT scan.  Objective   Blood pressure (!) 137/43, pulse (!) 109, temperature (!) 101.6 F (38.7 C), temperature source Axillary, resp. rate (!) 22, height _0  (1.626 m), weight 57.6 kg, SpO2 100 %.    Vent Mode: PRVC FiO2 (%):  [40 %-100 %] 40 % Set Rate:  [16 bmp] 16 bmp Vt Set:  [430 mL] 430 mL PEEP:  [5 cmH20] 5 cmH20 Plateau Pressure:  [14 cmH20-17 cmH20] 16 cmH20   Intake/Output Summary (Last 24 hours) at 06/08/2021 0806 Last data filed at 06/08/2021 0600 Gross per 24 hour  Intake 2302.73 ml  Output 525 ml  Net 1777.73 ml   Filed Weights   06/15/2021 0654 06/10/2021 0921  Weight: 66.2 kg 57.6 kg     Examination: General: Adult female, critically ill. Neuro: Essentially unresponsive.  Withdraws to pain LE's, decorticate posturing UE's. HEENT: [place]/AT. Sclerae anicteric. ETT in place. Cardiovascular: RRR, no M/R/G.  Lungs: Respirations even and unlabored.  CTA bilaterally, No W/R/R. Abdomen: BS x 4, soft, NT/ND.  Musculoskeletal: No gross deformities, no edema.  Skin: Intact, warm, no rashes.  Resolved Hospital Problem list     Assessment & Plan:   Right intraparenchymal hemorrhage with intraventricular hemorrhage, ICH score of 4 with expected mortality 97% Brain herniation syndrome - currently on 3% saline Acute hydrocephalus Traumatic subarachnoid hemorrhage on right side - presumed traumatic after fall Acute hypoxic respiratory failure due to aspiration pneumonitis - s/p intubation and zosyn in ED Hypertension Hyperlipidemia DNR Status  Repeat CT now for comparison sake and per family request Continue 3% saline with frequent Na checks Monitor serum sodium Continue neuro watch every hour Continue Clevidipine to maintain SBP < 140 Elevate head end of the bed Aspiration and seizure precautions Defer additional abx now Recommend comfort care.  After repeat CT, would suggest family meeting with neurology and neurosurgery presence if possible, along with CCM DNR status   Best Practice (right click and "Reselect all SmartList Selections" daily)   Diet/type: NPO DVT prophylaxis: not indicated GI prophylaxis: PPI Lines: N/A Foley:  Yes, and it is still needed Code Status:  full code Last date of multidisciplinary goals of care discussion: I met with husband and daughter at bedside with pt's RN present.  We discussed current circumstances and poor prognosis.  Repeat  CT scan ordered to help facilitate goals of care discussion regarding consideration of comfort measures.   Critical care time: 35 min.    Montey Hora, [facility]  Medicine For pager details, please see AMION or use Epic chat  After 1900, please call [place] for cross coverage needs 06/08/2021, 8:16 AM

## 2021-06-08 NOTE — Progress Notes (Signed)
Daily Progress Note   Patient Name: Melissa Jacobson       Date: 06/08/2021 DOB: 1951/04/16  Age: 70 y.o. MRN#: [id number] Attending Physician: Cheri Fowler, MD Primary Care Physician: Pcp, No Admit Date: 06/22/2021  Reason for Consultation/Follow-up: Establishing goals of care  Patient Profile/HPI: 70 y.o. female  with no known past medical history  admitted on 22-Jun-2021 after being found down in her home by her daughter. Workup reveals devastating multifocal ICH with IVH and transtentorial herniation. No surgical interventions recommended. ICH score initially 7 but very quickly declined to a 3 indicating a 97% likelihood of 30 day mortality. Palliative medicine consult for discussion of terminal care.  Subjective: [name] is at bedside. Patient's husband has arrived. [name] notes they are waiting for an additional family member to arrive and then will likely proceed with comfort measures. She does not have questions or concerns.   I returned this afternoon- patient has been extubated and transitioned to full comfort. She has labored breathing with increased rate. Family confirms desire for comfort only.   Vital Signs: BP 137/61   Pulse (!) 106   Temp 99.2 F (37.3 C) (Axillary)   Resp (!) 21   Ht 5\' 4"  (1.626 m)   Wt 57.6 kg   SpO2 99%   BMI 21.80 kg/m  SpO2: SpO2: 99 % O2 Device: O2 Device: Ventilator O2 Flow Rate:    Intake/output summary:  Intake/Output Summary (Last 24 hours) at 06/08/2021 1206 Last data filed at 06/08/2021 1100 Gross per 24 hour  Intake 2492.13 ml  Output 825 ml  Net 1667.13 ml   LBM: Last BM Date: Jun 22, 2021 Baseline Weight: Weight: 66.2 kg Most recent weight: Weight: 57.6 kg       Palliative Assessment/Data: PPS: 10%      Patient Active Problem  List   Diagnosis Date Noted  . Encounter for orogastric (OG) tube placement   . Respiratory failure (HCC)   . DNR (do not resuscitate)   . Subarachnoid hemorrhage 22-Jun-2021  . Intracerebral hemorrhage 06-22-21  . Brain bleed (HCC) 06-22-21    Palliative Care Assessment & Plan    Assessment/Recommendations/Plan  Patient now extubated to full comfort Start morphine infusion for comfort and to decrease dyspnea, other comfort orders as entered- D/C all other interventions and orders that are not required for comfort  Code Status: DNR  Prognosis:  Hours - Days  Discharge Planning: Anticipated Hospital Death  Care plan was discussed with family and care team.   Thank you for allowing the Palliative Medicine Team to assist in the care of this patient.  Total time:  38 minutes Prolonged billing: No     Greater than 50%  of this time was spent counseling and coordinating care related to the above assessment and plan.  Ocie Bob, AGNP-C Palliative Medicine   Please contact Palliative Medicine Team phone at [phone] for questions and concerns.

## 2021-06-08 NOTE — Care Plan (Signed)
GOALS OF CARE DISCUSSION   The Clinical status was relayed to patient's daughter and husband at bedside in detail.   Updated and notified of patients medical condition.     Patient remains unresponsive and will not open eyes to command.   Patient is having a weak cough and struggling to remove secretions.   Explained to family course of therapy and the modalities  Signs of brain damage    Patient with a very high probablity of a very minimal chance of meaningful recovery despite all aggressive and optimal medical therapy.  Patient's family agrees with the plan to proceed with comfort care and palliative extubation.  Comfort care orderswritten     Family are satisfied with Plan of action and management. All questions answered   Additional CC time 20 mins    Cheri Fowler MD [facility] See Amion for pager If no response to pager, please call [phone] until 7pm After 7pm, Please call E-link [phone]

## 2021-06-08 NOTE — TOC CAGE-AID Note (Signed)
Transition of Care [facility]) - CAGE-AID Screening   Patient Details  Name: Melissa Jacobson MRN: [id number] Date of Birth: [birth year]  Transition of Care [facility]) CM/SW Contact:    Stephen Turnbaugh C Tarpley-Carter, LCSWA Phone Number: 06/08/2021, 10:19 AM   Clinical Narrative: Pt is unable to participate in Cage Aid.  Pt is not appropriate for assessment.  [name], MSW, LCSW-A Pronouns:  She/Her/Hers [facility] HealthTransitions of Care Clinical Social Worker Direct Number:  [phone] [email or web address]  CAGE-AID Screening: Substance Abuse Screening unable to be completed due to: : Patient unable to participate             Substance Abuse Education Offered: No

## 2021-06-08 NOTE — Progress Notes (Signed)
eLink Physician-Brief Progress Note Patient Name: Melissa Jacobson DOB: [birth year] MRN: [id number]   Date of Service  06/08/2021  HPI/Events of Note  Patient was transitioned to comfort measures on the day shift, patient is now being downgraded and transferred to the medical floor to make room for incoming patient.  eICU Interventions  Transfer order entered.        Thomasene Lot [name] 06/08/2021, 7:50 PM

## 2021-06-09 DIAGNOSIS — I619 Nontraumatic intracerebral hemorrhage, unspecified: Secondary | ICD-10-CM | POA: Diagnosis not present

## 2021-06-12 LAB — CULTURE, BLOOD (ROUTINE X 2)
Culture: NO GROWTH
Culture: NO GROWTH
Special Requests: ADEQUATE

## 2021-06-26 NOTE — Progress Notes (Signed)
STROKE TEAM PROGRESS NOTE   INTERVAL HISTORY Patient has been made full comfort care measures upon request from family.  She is now moved to 6 N.  She has been started on IV morphine drip and is resting comfortably.  Her son-in-law is at the bedside and I spoke to her daughter via [facility]. Vitals:   06/08/21 1900 06/08/21 2000 06/08/21 2100 06/08/21 2315  BP:    (!) 143/47  Pulse: (!) 110 (!) 109 (!) 115 (!) 113  Resp: (!) 21 (!) 21 (!) 23 20  Temp:    (!) 102.7 F (39.3 C)  TempSrc:    Axillary  SpO2: (!) 74% (!) 74% (!) 59% (!) 71%  Weight:      Height:       CBC:  Recent Labs  Lab 06/06/2021 0651 06/06/2021 0702 06/17/2021 0810  WBC 12.5*  --   --   NEUTROABS 11.3*  --   --   HGB 11.4* 11.6* 10.5*  HCT 34.0* 34.0* 31.0*  MCV 89.0  --   --   PLT 183  --   --    Basic Metabolic Panel:  Recent Labs  Lab 06/08/2021 0651 06/17/2021 0702 06/13/2021 0810 06/11/2021 0818 06/08/21 0335 06/08/21 1211  NA 137 139 142   < > 152* 158*  K 3.7 3.6 3.5  --   --   --   CL 103 103  --   --   --   --   CO2 22  --   --   --   --   --   GLUCOSE 182* 182*  --   --   --   --   BUN 11 11  --   --   --   --   CREATININE 0.65 0.50  --   --   --   --   CALCIUM 9.3  --   --   --   --   --    < > = values in this interval not displayed.    Lipid Panel:  Recent Labs  Lab 06/06/2021 0651  CHOL 131  TRIG 87  HDL 54  CHOLHDL 2.4  VLDL 17  LDLCALC 60    HgbA1c:  Recent Labs  Lab 06/24/2021 0651  HGBA1C 5.3   Urine Drug Screen: No results for input(s): LABOPIA, COCAINSCRNUR, LABBENZ, AMPHETMU, THCU, LABBARB in the last 168 hours.  Alcohol Level  Recent Labs  Lab 06/06/2021 0651  ETH <10    IMAGING past 24 hours No results found.  PHYSICAL EXAM  Neurological exam patient is sedated on IV morphine drip mental status: Obtunded, no eye-opening to voice or noxious stimulation. When forced open left eye wanders without focus.  Nonverbal.  Does not follow any commands Cranials: Pupils are 3 mm  sluggishly reactive, no gaze preference or deviation, has occasional side to side  roving eye movements.  Does not blink to threat from either side, face appears symmetric. Motor examination: No spontaneous movement initially.  No response to sternal rub either.   Sensory exam: As above Coordination cannot be assessed Gait deferred ASSESSMENT/PLAN Ms. Melissa Jacobson is a 70 y.o. female with history of dyslipidemia, found her down on the ground unresponsive. She went to bed last night at 9 PM and at 11 PM family heard some loud thud.  They did not make much of it but this morning she would not wake up.  They called EMS who found her to be incontinent, vomited upon  herself and brought her in as a code stroke.  Systolic blood pressure 220s on site.  EMS also noted some right-sided gaze deviation which had resolved on the way. Poor examination with GCS 7 at best on arrival that quickly converted to a GCS of 3.  Emergent intubation. Noncontrasted CT was done prior to intubation. Noncontrasted CT shows multifocal ICH, IVH, obliteration of the basal ventricular system and compression of brainstem.   Daughter at bedside reports that she was in her usual state of health before going to bed yesterday.    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   Stroke, hemorrhagic multifocal ICH, IVH : Etiology hypertensive versus amyloid angiopathy CTH:  Severe multicentric right hemisphere intra-axial hemorrhage,t otal intra-axial blood volume estimated at roughly 128 mL. 2. Leftward midline shift of 9 mm with left uncal herniation and partially effaced basilar cisterns. Severe edema in the right hemisphere and likely also the brainstem. 3. Effaced right lateral and 3rd ventricles with trapped left Lateral ventricle and intraventricular hemorrhage  2D Echo pending   LDL No results found for requested labs within last 36468 hours. HgbA1c No results found  for requested labs within last 03212 hours. VTE prophylaxis - scd    Diet   Diet NPO time specified   No antithrombotic prior to admission, now on No antithrombotic.   Therapy recommendations:  pending Disposition:  pending  Hypertension Home meds:  none Unstable Permissive hypertension (OK if < 220/120) but gradually normalize in 5-7 days Long-term BP goal normotensive  Hyperlipidemia Home meds:  lipitor 20mg  ,   LDL No results found for requested labs within last hours., goal < 70   HgbA1c No results found for requested labs within last 24825 hours., goal < 7.0 CBGs Recent Labs    06/17/2021 0635  GLUCAP 170*    SSI  Other Stroke Risk Factors  Advanced Age >/= 68      Hospital day # 2 Patient has been made full comfort care measures only.  Continue morphine drip and titrate to patient comfort.  Long discussion with the patient's son-in-law and daughter and answered questions.  Expect hospital death over the next few days patient remained stable may consider hospice nursing home if family is agreeable.  Greater than 50% time with this 25-minute visit was spent in counseling and coordination of care discussion with patient's family and answering questions   76, MD Medical Director [facility] Pager: [phone] 05/29/2021 3:41 PM     To contact Stroke Continuity provider, please refer to 06/11/2021. After hours, contact General Neurology

## 2021-06-26 NOTE — Progress Notes (Signed)
Palliative Medicine RN Note: Arrived for a symptom check, and NT Day'jai was bathing patient. PAINAD 0, pt unresponsive. As we were pulling the fitted sheet up, I checked for a pulse, and there was none. I listened for breathing and heart sounds for 2 minutes; there were none.  TOD 1524.  Husband and son in law were waiting in the hall; I informed them of her death and provided support to them and to the pt's daughter via video call.  Family has not decided on a funeral home; pt will need to go to the morgue.   Margret Chance Harce Volden, RN, BSN, [facility] Palliative Medicine Team 06/24/2021 3:58 PM Office [phone]

## 2021-06-26 NOTE — Progress Notes (Signed)
Patient transferred to morgue  

## 2021-06-26 NOTE — Progress Notes (Signed)
70 year old female with large right frontoparietal intraparenchymal hemorrhage with IVH, ICH score of 4 Brain herniation syndrome Cytotoxic brain edema Acute obstructive hydrocephalus Traumatic subarachnoid hemorrhage status post fall Aspiration pneumonitis Hypertension hyperlipidemia  Patient was placed on comfort care, palliative extubation was performed yesterday She looks comfortable at this point Continue IV morphine for comfort    Cheri Fowler MD [facility] See Amion for pager If no response to pager, please call [phone] until 7pm After 7pm, Please call E-link [phone] \

## 2021-06-26 NOTE — Death Summary Note (Signed)
DEATH SUMMARY   Patient Details  Name: Melissa Jacobson MRN: [id number] DOB: [birth year]  Admission/Discharge Information   Admit Date:  2021-06-14  Date of Death: Date of Death: 06-16-2021  Time of Death: Time of Death: 1524  Length of Stay: 2  Referring Physician: Pcp, No   Reason(s) for Hospitalization  Right frontoparietal intraparenchymal hemorrhage with IVH, ICH score of 4 with expected mortality of 97% Brain herniation syndrome Cytotoxic brain edema Acute obstructive hydrocephalus Traumatic subarachnoid hemorrhage status post fall Acute hypoxic respiratory failure due to aspiration pneumonitis Hypertension Hyperlipidemia  Diagnoses  Preliminary cause of death: Cerebral edema with brain herniation on comfort care Secondary Diagnoses (including complications and co-morbidities):  Active Problems:   Subarachnoid hemorrhage   Intracerebral hemorrhage   Brain bleed (HCC)   Encounter for orogastric (OG) tube placement   Respiratory failure (HCC)   DNR (do not resuscitate)   Brief Hospital Course (including significant findings, care, treatment, and services provided and events leading to death)  Avagrace Botelho is a 70 y.o. year old female with hypertension and hyperlipidemia who was visiting from [place] to her daughter in [place].  Last well-known was 9 PM last night, her daughter heard tired around 33 PM, thought it was noise from neighbors.  She found her mother around 5:30 AM covered in vomitus and unresponsive, then called EMS and she was brought in here. On arrival stroke team was called, patient had CT head which showed large intraparenchymal hemorrhage with IVH, cerebral edema with midline shift and with brain herniation, she was intubated for airway protection and possible aspiration.  She was started on 3% saline after giving mannitol bolus. Neurosurgery was consulted, they recommend no surgical intervention and poor prognosis.  Palliative care is consulted, goals of  care discussion when he started with the family, after meeting with neurosurgery, neurology and CCM teams, they decided to proceed with comfort care and make patient DNR/DNI.  Patient was palliatively extubated on 9/13 and was transferred to hospice floor.  Patient passed on 06/16/2021 at 3:24 PM, patient family was at bedside    Pertinent Labs and Studies  Significant Diagnostic Studies CT Angio Head W or Wo Contrast  Result Date: 2021/06/14 CLINICAL DATA:  Neuro deficit, acute, stroke suspected. Found down and unresponsive with large right cerebral hemorrhage on CT. EXAM: CT ANGIOGRAPHY HEAD AND NECK TECHNIQUE: Multidetector CT imaging of the head and neck was performed using the standard protocol during bolus administration of intravenous contrast. Multiplanar CT image reconstructions and MIPs were obtained to evaluate the vascular anatomy. Carotid stenosis measurements (when applicable) are obtained utilizing NASCET criteria, using the distal internal carotid diameter as the denominator. CONTRAST:  89mL OMNIPAQUE IOHEXOL 350 MG/ML SOLN COMPARISON:  None. FINDINGS: CTA NECK FINDINGS Aortic arch: Standard 3 vessel aortic arch with minimal atherosclerotic plaque. Widely patent arch vessel origins. Right carotid system: Patent with a small amount of calcified and soft plaque at the carotid bifurcation. No evidence of a significant stenosis or dissection. Left carotid system: Patent without evidence of stenosis, dissection, or significant atherosclerosis. Vertebral arteries: Patent without evidence of stenosis, dissection, or significant atherosclerosis. Dominant left vertebral artery. Skeleton: No acute osseous abnormality or suspicious osseous lesion. Severe right facet arthrosis at C4-5 with grade 1 anterolisthesis. Mild-to-moderate disc degeneration at C5-6 and C6-7. Other neck: No evidence of cervical lymphadenopathy or mass. Upper chest: Endotracheal tube terminates above the carina. Partially visualized  enteric tube. Clear lung apices. Review of the MIP images confirms the above findings CTA HEAD FINDINGS  Anterior circulation: The internal carotid arteries are patent from skull base to carotid termini with mild atherosclerotic plaque not resulting in a significant stenosis. ACAs and MCAs are patent without evidence of a proximal branch occlusion or flow limiting A1 or M1 stenosis. There is asymmetric irregular narrowing and attenuation of right MCA branch vessels. No aneurysm is identified, and no CTA spot sign is evident in the right cerebral hemispheric hemorrhage. Posterior circulation: The intracranial vertebral arteries are widely patent to the basilar. The basilar artery is widely patent. There is a large right posterior communicating artery with hypoplastic right P1 segment. Both PCAs are patent without evidence of a significant proximal stenosis. No aneurysm is identified. Venous sinuses: As permitted by contrast timing, patent. Anatomic variants: Fetal right PCA. Review of the MIP images confirms the above findings IMPRESSION: 1. Mild atherosclerosis in the head and neck without a large vessel occlusion, flow limiting proximal stenosis, or aneurysm. 2. Asymmetric attenuation and irregular narrowing of right MCA branch vessels. 3. Aortic Atherosclerosis (ICD10-I70.0). Electronically Signed   By: Sebastian Ache M.D.   On: 06/18/2021 09:07   CT HEAD WO CONTRAST ( )  Result Date: 06/08/2021 CLINICAL DATA:  Stroke follow-up. EXAM: CT HEAD WITHOUT CONTRAST TECHNIQUE: Contiguous axial images were obtained from the base of the skull through the vertex without intravenous contrast. COMPARISON:  Head CT from yesterday FINDINGS: Brain: Large parenchymal hemorrhage in the right cerebellum which is difficult to reproducibly quantify given the irregular shape and involvement spanning the anterior temporal lobe to frontal parietal vertex. There has been some evolution with low-density now seen where closest to the  right lateral ventricle, but no increase. Increase in adjacent vasogenic edema. Subarachnoid and intraventricular clot. The subarachnoid blood appears stable and the lateral intraventricular clot has diminished. Midline shift measures 12 mm with preferential dilatation of the left lateral ventricle and effacement of the right lateral ventricle. Uncal and subfalcine herniation without complicating infarcts seen. Vascular: Stable Skull: No acute finding Sinuses/Orbits: No acute finding IMPRESSION: 1. No evidence of rebleed. 2. Decreasing intraventricular clot. 3. Expected increased vasogenic edema. Midline shift measures 12 mm and there is a degree of left lateral ventricular entrapment. Electronically Signed   By: Tiburcio Pea M.D.   On: 06/08/2021 08:59   CT Angio Neck W and/or Wo Contrast  Result Date: 06/02/2021 CLINICAL DATA:  Neuro deficit, acute, stroke suspected. Found down and unresponsive with large right cerebral hemorrhage on CT. EXAM: CT ANGIOGRAPHY HEAD AND NECK TECHNIQUE: Multidetector CT imaging of the head and neck was performed using the standard protocol during bolus administration of intravenous contrast. Multiplanar CT image reconstructions and MIPs were obtained to evaluate the vascular anatomy. Carotid stenosis measurements (when applicable) are obtained utilizing NASCET criteria, using the distal internal carotid diameter as the denominator. CONTRAST:  30mL OMNIPAQUE IOHEXOL 350 MG/ML SOLN COMPARISON:  None. FINDINGS: CTA NECK FINDINGS Aortic arch: Standard 3 vessel aortic arch with minimal atherosclerotic plaque. Widely patent arch vessel origins. Right carotid system: Patent with a small amount of calcified and soft plaque at the carotid bifurcation. No evidence of a significant stenosis or dissection. Left carotid system: Patent without evidence of stenosis, dissection, or significant atherosclerosis. Vertebral arteries: Patent without evidence of stenosis, dissection, or significant  atherosclerosis. Dominant left vertebral artery. Skeleton: No acute osseous abnormality or suspicious osseous lesion. Severe right facet arthrosis at C4-5 with grade 1 anterolisthesis. Mild-to-moderate disc degeneration at C5-6 and C6-7. Other neck: No evidence of cervical lymphadenopathy or mass. Upper chest: Endotracheal  tube terminates above the carina. Partially visualized enteric tube. Clear lung apices. Review of the MIP images confirms the above findings CTA HEAD FINDINGS Anterior circulation: The internal carotid arteries are patent from skull base to carotid termini with mild atherosclerotic plaque not resulting in a significant stenosis. ACAs and MCAs are patent without evidence of a proximal branch occlusion or flow limiting A1 or M1 stenosis. There is asymmetric irregular narrowing and attenuation of right MCA branch vessels. No aneurysm is identified, and no CTA spot sign is evident in the right cerebral hemispheric hemorrhage. Posterior circulation: The intracranial vertebral arteries are widely patent to the basilar. The basilar artery is widely patent. There is a large right posterior communicating artery with hypoplastic right P1 segment. Both PCAs are patent without evidence of a significant proximal stenosis. No aneurysm is identified. Venous sinuses: As permitted by contrast timing, patent. Anatomic variants: Fetal right PCA. Review of the MIP images confirms the above findings IMPRESSION: 1. Mild atherosclerosis in the head and neck without a large vessel occlusion, flow limiting proximal stenosis, or aneurysm. 2. Asymmetric attenuation and irregular narrowing of right MCA branch vessels. 3. Aortic Atherosclerosis (ICD10-I70.0). Electronically Signed   By: Sebastian Ache M.D.   On: 2021-06-26 09:07   DG Chest Portable 1 View  Result Date: 06-26-21 CLINICAL DATA:  Intracranial hemorrhage.  Ventilator dependence. EXAM: PORTABLE CHEST 1 VIEW COMPARISON:  None. FINDINGS: 0654 hours. Endotracheal  tube tip is 2.5 cm above the base of the carina. The NG tube passes into the stomach although the distal tip position is not included on the film. Lungs are clear bilaterally. The cardiopericardial silhouette is within normal limits for size. The visualized bony structures of the thorax show no acute abnormality. Telemetry leads overlie the chest. IMPRESSION: No active disease. Electronically Signed   By: Kennith Center M.D.   On: 06-26-2021 07:33   DG Abd Portable 1V  Result Date: 26-Jun-2021 CLINICAL DATA:  Orogastric tube placement. EXAM: PORTABLE ABDOMEN - 1 VIEW COMPARISON:  None. FINDINGS: The bowel gas pattern is normal. Distal tip of nasogastric tube is seen in expected position of the stomach. No radio-opaque calculi or other significant radiographic abnormality are seen. IMPRESSION: Distal tip of nasogastric tube seen in expected position of the stomach. Electronically Signed   By: Lupita Raider M.D.   On: 06-26-21 14:00   CT HEAD CODE STROKE WO CONTRAST  Result Date: 2021/06/26 CLINICAL DATA:  Code stroke.  70 year old female. EXAM: CT HEAD WITHOUT CONTRAST TECHNIQUE: Contiguous axial images were obtained from the base of the skull through the vertex without intravenous contrast. COMPARISON:  None. FINDINGS: Brain: Large multicentric hyperdense intra-axial hemorrhage in the right hemisphere including a component in the posterior right frontal and parietal lobes encompassing 51 by 31 x 45 mm (estimated volume 36 mL). And a 2nd, larger hemorrhage centered in the right temporal lobe and frontal operculum encompassing 62 by 54 by 55 mm (AP by transverse by CC), estimated volume 92 mL). Severe right hemisphere edema. Intraventricular extension, but subtotal effacement of the right lateral ventricle with leftward midline shift of 9 mm. The left lateral ventricle appears trapped with moderate IVH distending the atrium and left occipital horn. Trace right superior convexity subarachnoid hemorrhage.  There is right uncal herniation. And effaced suprasellar, ambient, and prepontine cisterns with conspicuous hypodensity in the brainstem. Less pronounced left hemisphere edema. Third ventricle completely effaced. Fourth ventricle appears relatively normal. No definite cerebellar edema. No tonsillar herniation at this time. Vascular:  Only mild Calcified atherosclerosis at the skull base. No suspicious intracranial vascular hyperdensity. Skull: Intact.  No acute or suspicious osseous lesion. Sinuses/Orbits: Small volume layering fluid in some of the paranasal sinuses. Tympanic cavities and mastoids are clear. Other: Visualized orbits and scalp soft tissues are within normal limits. Total score (0-10 with 10 being normal): Not applicable, severe intracranial hemorrhage. IMPRESSION: 1. Severe multicentric right hemisphere intra-axial hemorrhage, total intra-axial blood volume estimated at roughly 128 mL. 2. Leftward midline shift of 9 mm with left uncal herniation and partially effaced basilar cisterns. Severe edema in the right hemisphere and likely also the brainstem. 3. Effaced right lateral and 3rd ventricles with trapped left lateral ventricle and intraventricular hemorrhage. Small volume right hemisphere subarachnoid hemorrhage. 4. Critical Value/emergent results were called by telephone at the time of interpretation on Jun 29, 2021 at 0655 hours to Dr. Milon Dikes , who verbally acknowledged these results. Electronically Signed   By: Odessa Fleming M.D.   On: Jun 29, 2021 07:08    Microbiology Recent Results (from the past 240 hour(s))  Resp Panel by RT-PCR (Flu A&B, Covid) Nasopharyngeal Swab     Status: None   Collection Time: 06/29/21  6:53 AM   Specimen: Nasopharyngeal Swab; Nasopharyngeal(NP) swabs in vial transport medium  Result Value Ref Range Status   SARS Coronavirus 2 by RT PCR NEGATIVE NEGATIVE Final    Comment: (NOTE) SARS-CoV-2 target nucleic acids are NOT DETECTED.  The SARS-CoV-2 RNA is generally  detectable in upper respiratory specimens during the acute phase of infection. The lowest concentration of SARS-CoV-2 viral copies this assay can detect is 138 copies/mL. A negative result does not preclude SARS-Cov-2 infection and should not be used as the sole basis for treatment or other patient management decisions. A negative result may occur with  improper specimen collection/handling, submission of specimen other than nasopharyngeal swab, presence of viral mutation(s) within the areas targeted by this assay, and inadequate number of viral copies(<138 copies/mL). A negative result must be combined with clinical observations, patient history, and epidemiological information. The expected result is Negative.  Fact Sheet for Patients:  BloggerCourse.com  Fact Sheet for Healthcare Providers:  SeriousBroker.it  This test is no t yet approved or cleared by the [place] FDA and  has been authorized for detection and/or diagnosis of SARS-CoV-2 by FDA under an Emergency Use Authorization (EUA). This EUA will remain  in effect (meaning this test can be used) for the duration of the COVID-19 declaration under Section 564(b)(1) of the Act, 21 U.S.C.section 360bbb-3(b)(1), unless the authorization is terminated  or revoked sooner.       Influenza A by PCR NEGATIVE NEGATIVE Final   Influenza B by PCR NEGATIVE NEGATIVE Final    Comment: (NOTE) The Xpert Xpress SARS-CoV-2/FLU/RSV plus assay is intended as an aid in the diagnosis of influenza from Nasopharyngeal swab specimens and should not be used as a sole basis for treatment. Nasal washings and aspirates are unacceptable for Xpert Xpress SARS-CoV-2/FLU/RSV testing.  Fact Sheet for Patients: BloggerCourse.com  Fact Sheet for Healthcare Providers: SeriousBroker.it  This test is not yet approved or cleared by the [place] FDA  and has been authorized for detection and/or diagnosis of SARS-CoV-2 by FDA under an Emergency Use Authorization (EUA). This EUA will remain in effect (meaning this test can be used) for the duration of the COVID-19 declaration under Section 564(b)(1) of the Act, 21 U.S.C. section 360bbb-3(b)(1), unless the authorization is terminated or revoked.  Performed at [facility], [address] [place], [place] [address]   Blood culture (routine x 2)     Status: None (Preliminary result)   Collection Time: 06/19/2021  8:18 AM   Specimen: BLOOD  Result Value Ref Range Status   Specimen Description BLOOD SITE NOT SPECIFIED  Final   Special Requests   Final    BOTTLES DRAWN AEROBIC AND ANAEROBIC Blood Culture adequate volume   Culture   Final    NO GROWTH 2 DAYS Performed at [facility], [address], [place], [place] [address]    Report Status PENDING  Incomplete  Blood culture (routine x 2)     Status: None (Preliminary result)   Collection Time: 06/06/2021  8:18 AM   Specimen: BLOOD  Result Value Ref Range Status   Specimen Description BLOOD SITE NOT SPECIFIED  Final   Special Requests   Final    BOTTLES DRAWN AEROBIC ONLY Blood Culture results may not be optimal due to an inadequate volume of blood received in culture bottles   Culture   Final    NO GROWTH 2 DAYS Performed at [facility], [address] [place], [place] [address]    Report Status PENDING  Incomplete  MRSA Next Gen by PCR, Nasal     Status: None   Collection Time: 06/25/2021  9:25 AM   Specimen: Nasal Mucosa; Nasal Swab  Result Value Ref Range Status   MRSA by PCR Next Gen NOT DETECTED NOT DETECTED Final    Comment: (NOTE) The GeneXpert MRSA Assay (FDA approved for NASAL specimens only), is one component of a comprehensive MRSA colonization surveillance program. It is not intended to diagnose MRSA infection nor to guide or monitor treatment for MRSA infections. Test performance is not FDA  approved in patients less than 48 years old. Performed at [facility], [address], [place], [place] [address]     Lab Basic Metabolic Panel: Recent Labs  Lab 06/20/[phone] 06/11/2021 0702 06/22/2021 0810 06/06/2021 0818 06/01/2021 1415 06/18/2021 1917 06/08/21 0335 06/08/21 1211  NA 137 139 142 141 146* 148* 152* 158*  K 3.7 3.6 3.5  --   --   --   --   --   CL 103 103  --   --   --   --   --   --   CO2 22  --   --   --   --   --   --   --   GLUCOSE 182* 182*  --   --   --   --   --   --   BUN 11 11  --   --   --   --   --   --   CREATININE 0.65 0.50  --   --   --   --   --   --   CALCIUM 9.3  --   --   --   --   --   --   --    Liver Function Tests: Recent Labs  Lab 05/27/2021 0651  AST 31  ALT 20  ALKPHOS 51  BILITOT 1.2  PROT 6.8  ALBUMIN 4.3   No results for input(s): LIPASE, AMYLASE in the last 168 hours. No results for input(s): AMMONIA in the last 168 hours. CBC: Recent Labs  Lab 05/29/2021 0651 06/02/2021 0702 05/27/2021 0810  WBC 12.5*  --   --   NEUTROABS 11.3*  --   --  HGB 11.4* 11.6* 10.5*  HCT 34.0* 34.0* 31.0*  MCV 89.0  --   --   PLT 183  --   --    Cardiac Enzymes: No results for input(s): CKTOTAL, CKMB, CKMBINDEX, TROPONINI in the last 168 hours. Sepsis Labs: Recent Labs  Lab June 16, 2021 0651  WBC 12.5*    Procedures/Operations     Nakisha Chai 06/04/2021, 4:44 PM

## 2021-06-26 NOTE — Progress Notes (Signed)
Nutrition Brief Note  Chart reviewed. Pt now transitioning to comfort care.  No further nutrition interventions planned at this time.  Please re-consult as needed.   Teresita Fanton W, RD, LDN, CDCES Registered Dietitian II Certified Diabetes Care and Education Specialist Please refer to AMION for RD and/or RD on-call/weekend/after hours pager   

## 2021-06-26 DEATH — deceased

## 2022-10-16 IMAGING — CT CT HEAD W/O CM
3 of 6 series · 13 of 47 positions shown, 15 images · non-contrast
Comparison: Head CT from yesterday

CLINICAL DATA: Stroke follow-up.

EXAM:
CT HEAD WITHOUT CONTRAST
TECHNIQUE: Contiguous axial images were obtained from the base of the skull
through the vertex without intravenous contrast.

[Series 4: head 5.0 h30s · axial · 0.44mm/px · z∈[-32,+88]mm · 8 of 31 slices shown, 10 images]
[im 4/31  brain]
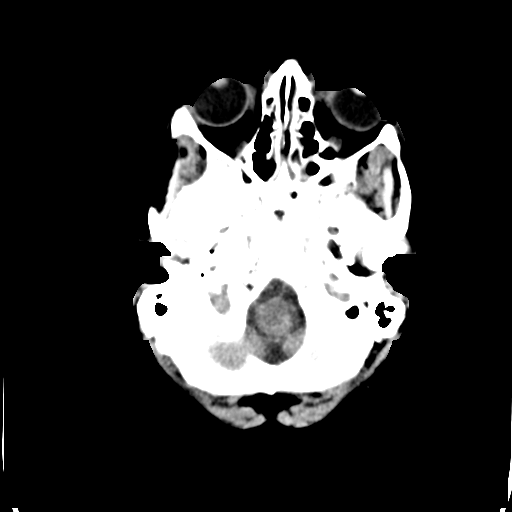
[im 4/31  bone]
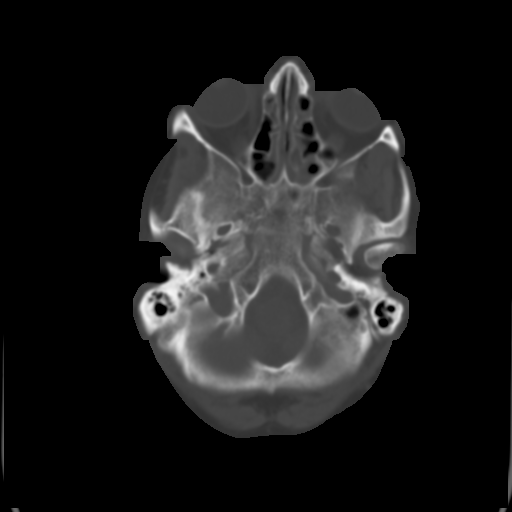
[im 7/31  brain]
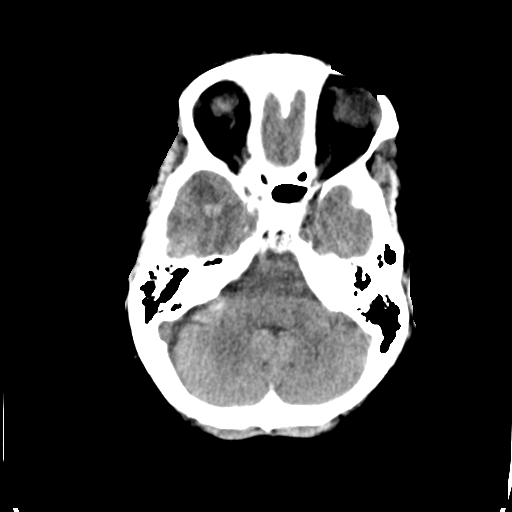
[im 10/31  brain]
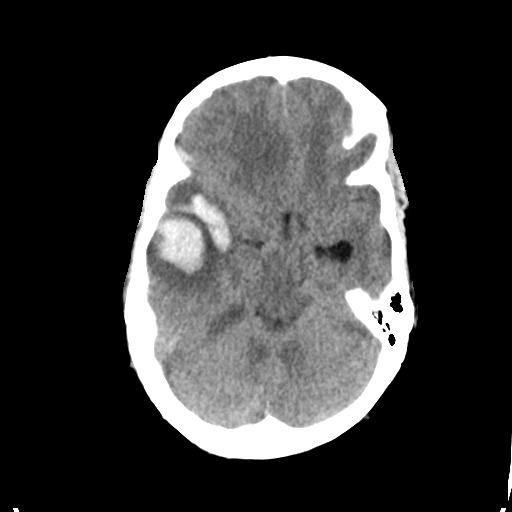
[im 14/31  brain]
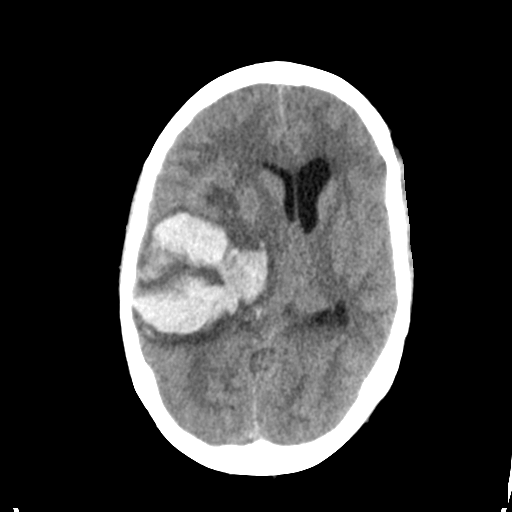
[im 17/31  brain]
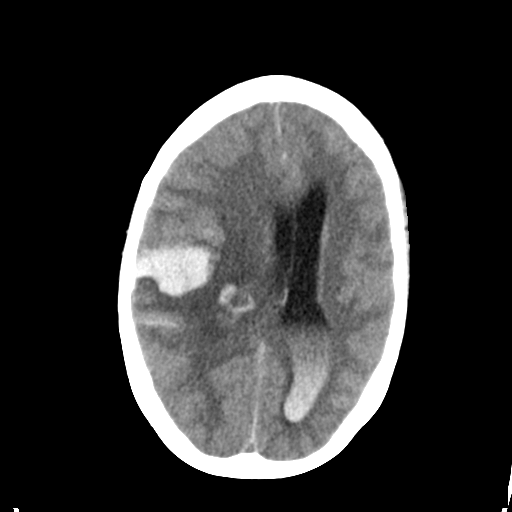
[im 17/31  bone]
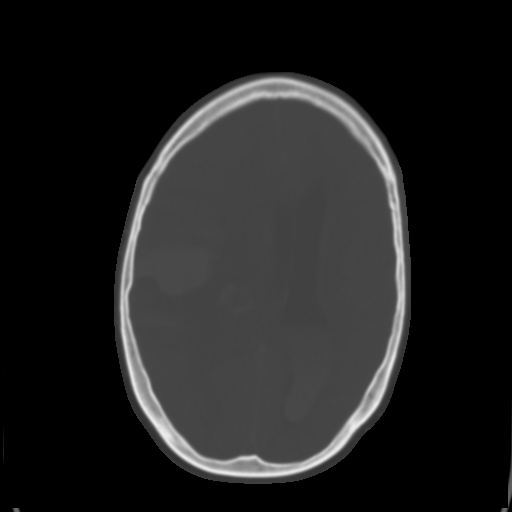
[im 22/31  brain]
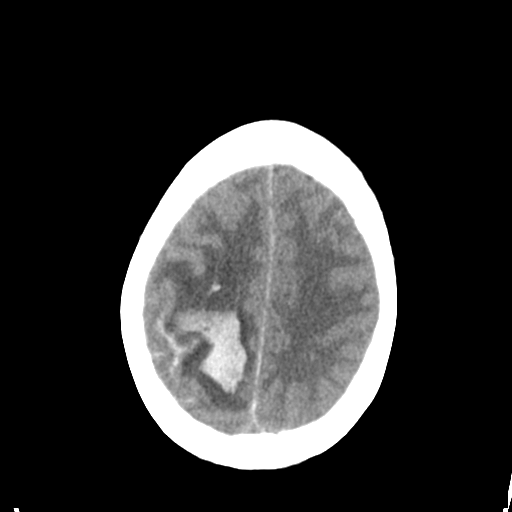
[im 25/31  brain]
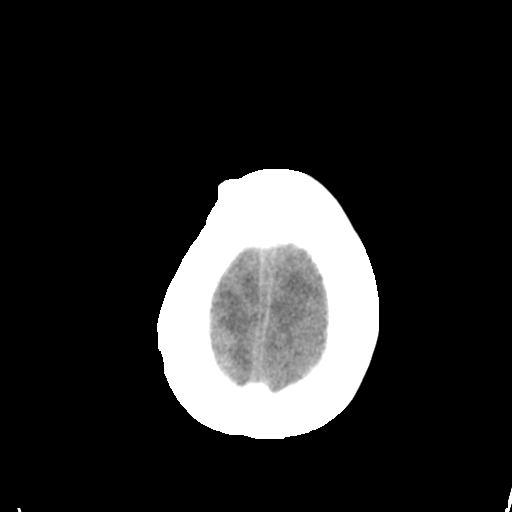
[im 28/31  brain]
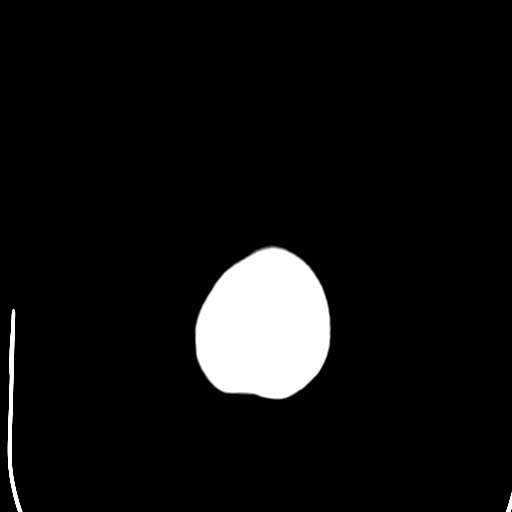

[Series 6: head 3.0 mpr sag · sagittal · 0.31mm/px · 2 of 48 slices shown]
[im 16/48  brain]
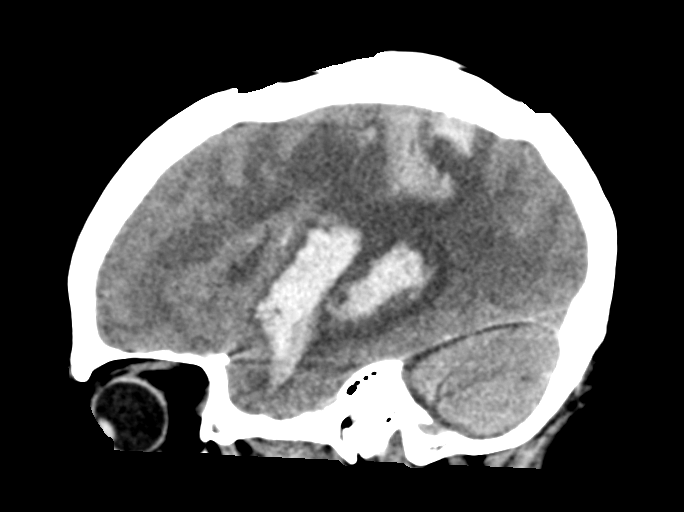
[im 32/48  brain]
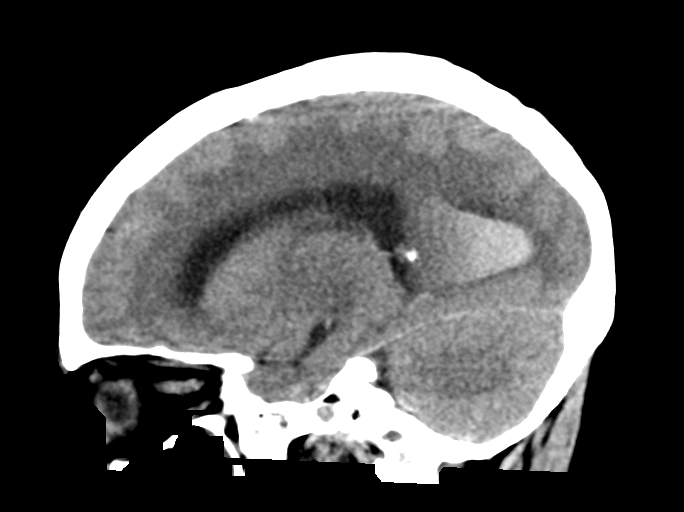

[Series 9: head 3.0 mpr cor · coronal · 0.17mm/px · 3 of 61 slices shown]
[im 16/61  brain]
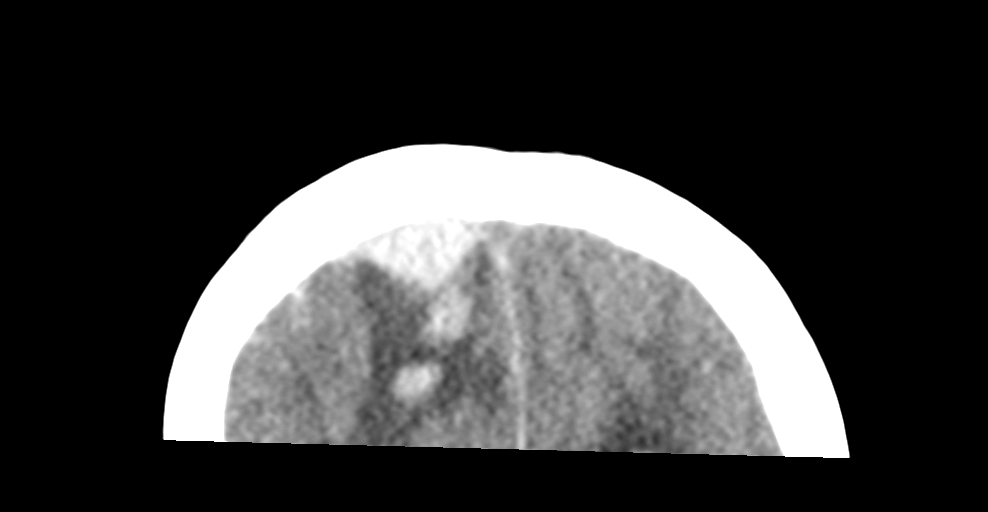
[im 31/61  brain]
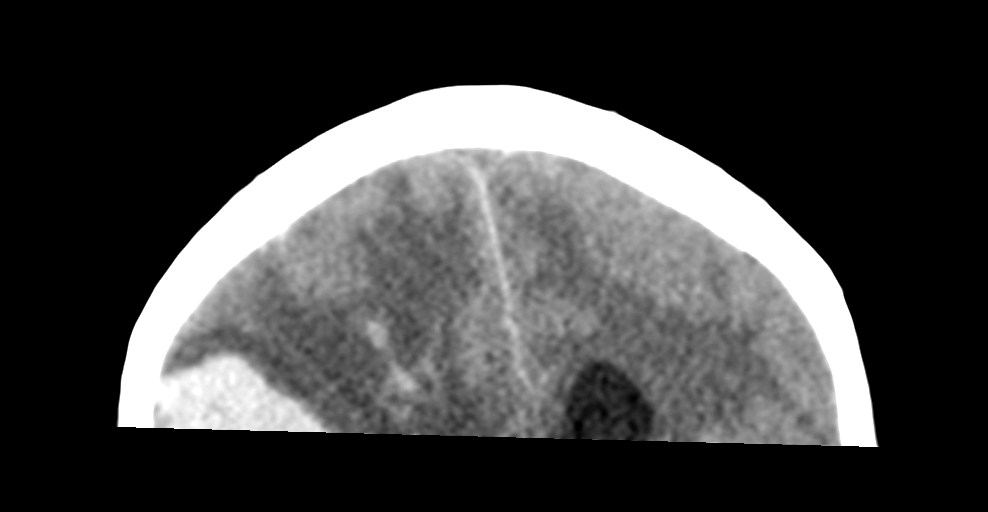
[im 46/61  brain]
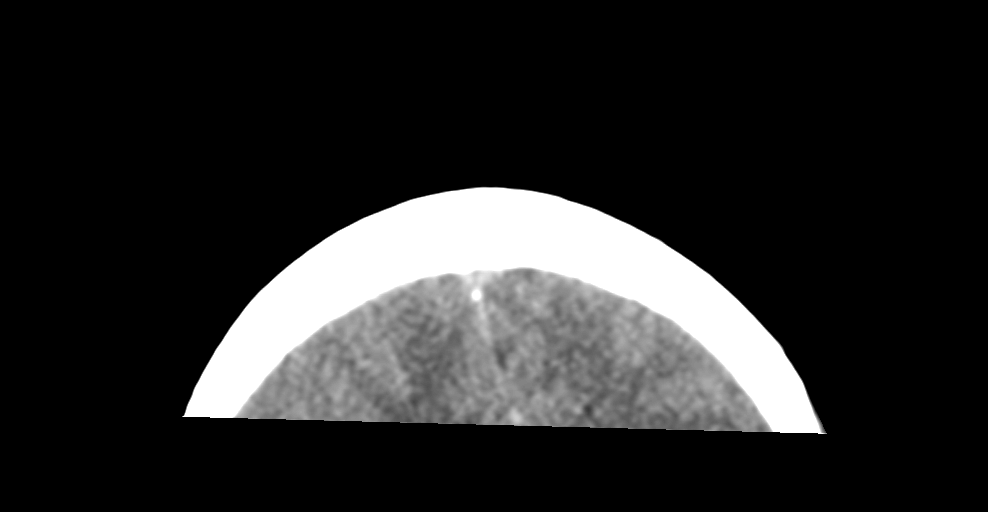

[13 of 47 positions shown; findings below may reference images not displayed]

FINDINGS: Brain: Large parenchymal hemorrhage in the right cerebellum which is
difficult to reproducibly quantify given the irregular shape and
involvement spanning the anterior temporal lobe to frontal parietal
vertex. There has been some evolution with low-density now seen
where closest to the right lateral ventricle, but no increase.
Increase in adjacent vasogenic edema. Subarachnoid and
intraventricular clot. The subarachnoid blood appears stable and the
lateral intraventricular clot has diminished. Midline shift measures
12 mm with preferential dilatation of the left lateral ventricle and
effacement of the right lateral ventricle. Uncal and subfalcine
herniation without complicating infarcts seen.

Vascular: Stable

Skull: No acute finding

Sinuses/Orbits: No acute finding
IMPRESSION: 1. No evidence of rebleed.
2. Decreasing intraventricular clot.
3. Expected increased vasogenic edema. Midline shift measures 12 mm
and there is a degree of left lateral ventricular entrapment.
